# Patient Record
Sex: Male | Born: 1940 | Race: White | Hispanic: No | Marital: Married | Smoking: Former smoker
Health system: Southern US, Community
[De-identification: ages and names within clinical notes are randomized; demographics above are authoritative.]

## PROBLEM LIST (undated history)

## (undated) DIAGNOSIS — I2510 Atherosclerotic heart disease of native coronary artery without angina pectoris: Secondary | ICD-10-CM

## (undated) DIAGNOSIS — E785 Hyperlipidemia, unspecified: Secondary | ICD-10-CM

## (undated) DIAGNOSIS — I10 Essential (primary) hypertension: Secondary | ICD-10-CM

## (undated) HISTORY — PX: OTHER SURGICAL HISTORY: SHX169

## (undated) HISTORY — PX: HAND SURGERY: SHX662

## (undated) HISTORY — DX: Essential (primary) hypertension: I10

## (undated) HISTORY — DX: Hyperlipidemia, unspecified: E78.5

## (undated) HISTORY — PX: TONSILLECTOMY: SUR1361

## (undated) HISTORY — DX: Atherosclerotic heart disease of native coronary artery without angina pectoris: I25.10

---

## 1998-01-05 ENCOUNTER — Encounter: Admission: RE | Admit: 1998-01-05 | Discharge: 1998-01-05 | Payer: Self-pay | Admitting: *Deleted

## 1998-05-13 ENCOUNTER — Ambulatory Visit (HOSPITAL_BASED_OUTPATIENT_CLINIC_OR_DEPARTMENT_OTHER): Admission: RE | Admit: 1998-05-13 | Discharge: 1998-05-13 | Payer: Self-pay | Admitting: Orthopaedic Surgery

## 2001-03-27 HISTORY — PX: CORONARY ARTERY BYPASS GRAFT: SHX141

## 2002-03-11 ENCOUNTER — Inpatient Hospital Stay (HOSPITAL_COMMUNITY): Admission: EM | Admit: 2002-03-11 | Discharge: 2002-03-18 | Payer: Self-pay | Admitting: Critical Care Medicine

## 2002-03-12 ENCOUNTER — Encounter: Payer: Self-pay | Admitting: Thoracic Surgery (Cardiothoracic Vascular Surgery)

## 2002-03-13 ENCOUNTER — Encounter: Payer: Self-pay | Admitting: Thoracic Surgery (Cardiothoracic Vascular Surgery)

## 2002-03-14 ENCOUNTER — Encounter: Payer: Self-pay | Admitting: Thoracic Surgery (Cardiothoracic Vascular Surgery)

## 2002-03-15 ENCOUNTER — Encounter: Payer: Self-pay | Admitting: Thoracic Surgery (Cardiothoracic Vascular Surgery)

## 2003-10-16 ENCOUNTER — Encounter (INDEPENDENT_AMBULATORY_CARE_PROVIDER_SITE_OTHER): Payer: Self-pay | Admitting: *Deleted

## 2003-10-16 ENCOUNTER — Inpatient Hospital Stay (HOSPITAL_COMMUNITY): Admission: RE | Admit: 2003-10-16 | Discharge: 2003-10-18 | Payer: Self-pay | Admitting: Urology

## 2009-07-31 ENCOUNTER — Ambulatory Visit: Payer: Self-pay | Admitting: Vascular Surgery

## 2009-09-01 ENCOUNTER — Ambulatory Visit: Payer: Self-pay | Admitting: Vascular Surgery

## 2009-09-28 ENCOUNTER — Inpatient Hospital Stay (HOSPITAL_COMMUNITY): Admission: RE | Admit: 2009-09-28 | Discharge: 2009-09-29 | Payer: Self-pay | Admitting: Vascular Surgery

## 2009-09-28 HISTORY — PX: ABDOMINAL AORTIC ANEURYSM REPAIR: SHX42

## 2009-10-29 ENCOUNTER — Ambulatory Visit: Payer: Self-pay | Admitting: Vascular Surgery

## 2009-10-29 ENCOUNTER — Encounter: Admission: RE | Admit: 2009-10-29 | Discharge: 2009-10-29 | Payer: Self-pay | Admitting: Vascular Surgery

## 2009-11-16 ENCOUNTER — Ambulatory Visit: Payer: Self-pay | Admitting: Vascular Surgery

## 2009-11-24 ENCOUNTER — Ambulatory Visit: Payer: Self-pay | Admitting: Vascular Surgery

## 2010-04-16 ENCOUNTER — Other Ambulatory Visit: Payer: Self-pay | Admitting: Vascular Surgery

## 2010-04-16 DIAGNOSIS — I714 Abdominal aortic aneurysm, without rupture: Secondary | ICD-10-CM

## 2010-05-02 ENCOUNTER — Other Ambulatory Visit: Payer: Self-pay

## 2010-05-09 ENCOUNTER — Ambulatory Visit
Admission: RE | Admit: 2010-05-09 | Discharge: 2010-05-09 | Disposition: A | Discharge status: Home / Self Care | Payer: Medicare Other | Source: Ambulatory Visit | Attending: Vascular Surgery | Admitting: Vascular Surgery

## 2010-05-09 DIAGNOSIS — I714 Abdominal aortic aneurysm, without rupture: Secondary | ICD-10-CM

## 2010-05-09 MED ORDER — IOHEXOL 350 MG/ML SOLN
100.0000 mL | Freq: Once | INTRAVENOUS | Status: AC | PRN
  Administered 2010-05-09: 100 mL via INTRAVENOUS

## 2010-05-10 ENCOUNTER — Ambulatory Visit (INDEPENDENT_AMBULATORY_CARE_PROVIDER_SITE_OTHER): Payer: Medicare Other | Admitting: Vascular Surgery

## 2010-05-10 ENCOUNTER — Other Ambulatory Visit: Payer: Self-pay

## 2010-05-10 ENCOUNTER — Ambulatory Visit: Payer: Self-pay | Admitting: Vascular Surgery

## 2010-05-10 DIAGNOSIS — I714 Abdominal aortic aneurysm, without rupture, unspecified: Secondary | ICD-10-CM

## 2010-05-11 NOTE — Assessment & Plan Note (Signed)
OFFICE VISIT  ESIAS, MORY DOB:  07/27/1940                                       05/10/2010 EAVWU#:[id number]  Patient presents today for continued followup of his stent graft repair of abdominal aortic aneurysm.  His surgery was in July 2011.  He did have a serous collection in his right groin which was aspirated and had no growth and now is completely resolved.  His health is otherwise quite good.  He has no restrictions.  He has a walking program.  PHYSICAL EXAMINATION:  His blood pressure is 135/82, pulse 72, respirations 20.  In no acute distress.  HEENT is normal.  His abdomen reveals no evidence of pulsatile mass.  He does have normal 2+ femoral pulses bilaterally with well-healed incisions and no evidence of fluid collection.  He underwent a CT scan on 02/13.  I have reviewed his films and discussed this with patient.  This shows excellent positioning of his stent graft with no evidence of endoleak.  His aneurysm size has continued to shrink from 5.0 on his last in August 2011 down to 4.1 mm maximal sac size today.  I am quite pleased with his result.  We will see him again in 1 year with repeat CT scan for continued follow-up.    Larina Earthly, M.D.  TFE/MEDQ  D:  05/10/2010  T:  05/11/2010  Job:  [id number]

## 2010-06-12 LAB — BLOOD GAS, ARTERIAL
Acid-base deficit: 1 mmol/L (ref 0.0–2.0)
Bicarbonate: 23.3 mEq/L (ref 20.0–24.0)
Drawn by: 206361
FIO2: 0.21 %
O2 Saturation: 97.8 %
pO2, Arterial: 96.7 mmHg (ref 80.0–100.0)

## 2010-06-12 LAB — URINALYSIS, ROUTINE W REFLEX MICROSCOPIC
Bilirubin Urine: NEGATIVE
Glucose, UA: NEGATIVE mg/dL
Ketones, ur: NEGATIVE mg/dL
Specific Gravity, Urine: 1.017 (ref 1.005–1.030)
pH: 7.5 (ref 5.0–8.0)

## 2010-06-12 LAB — COMPREHENSIVE METABOLIC PANEL
AST: 17 U/L (ref 0–37)
Albumin: 4.3 g/dL (ref 3.5–5.2)
Alkaline Phosphatase: 51 U/L (ref 39–117)
BUN: 14 mg/dL (ref 6–23)
CO2: 22 mEq/L (ref 19–32)

## 2010-06-12 LAB — BASIC METABOLIC PANEL
CO2: 26 mEq/L (ref 19–32)
Calcium: 8.3 mg/dL — ABNORMAL LOW (ref 8.4–10.5)
Chloride: 107 mEq/L (ref 96–112)
Creatinine, Ser: 0.87 mg/dL (ref 0.4–1.5)
GFR calc non Af Amer: >60 mL/min (ref >60)
Glucose, Bld: 170 mg/dL — ABNORMAL HIGH (ref 70–99)

## 2010-06-12 LAB — PROTIME-INR: Prothrombin Time: 13.3 seconds (ref 11.6–15.2)

## 2010-06-12 LAB — TYPE AND SCREEN: ABO/RH(D): A POS

## 2010-06-12 LAB — CBC
HCT: 39.1 % (ref 39.0–52.0)
Hemoglobin: 11.5 g/dL — ABNORMAL LOW (ref 13.0–17.0)
MCH: 31.2 pg (ref 26.0–34.0)
MCH: 31.3 pg (ref 26.0–34.0)
MCHC: 34 g/dL (ref 30.0–36.0)
MCV: 91.7 fL (ref 78.0–100.0)
MCV: 90.1 fL (ref 78.0–100.0)
Platelets: 150 10*3/uL (ref 150–400)
RBC: 3.68 MIL/uL — ABNORMAL LOW (ref 4.22–5.81)
WBC: 7.2 10*3/uL (ref 4.0–10.5)

## 2010-06-12 LAB — MRSA PCR SCREENING: MRSA by PCR: NEGATIVE

## 2010-08-09 NOTE — Consult Note (Signed)
NEW PATIENT CONSULTATION   [name], [name]  DOB:  1940-11-27                                       09/01/2009  CHART#:[id number]   The patient presents today for discussion of infrarenal abdominal aortic  aneurysm.  This has been known about at least for 3-4 years.  The  patient has a strong family history of aneurysms in his siblings and did  have an ultrasound he feels at least 3-4 years ago showing a maximal  diameter of 3.5 cm.  He has had serial ultrasound followups since that  time and his ultrasound had been suggesting a 4.7 cm aneurysm as  recently as 2-3 months ago.  He recently had an episode while visiting  relatives in [place] where he had frank hematuria.  A CAT scan for further  followup of this showed some left kidney pathology not specifically a  stone and also showed that his aneurysm in 5.3 cm in diameter.  I am  seeing him now for further discussion.  He did not have any evidence of  leak in this and he reports continuing having some left flank pain and  is to undergo urologic evaluation as well.  He does have concerns  regarding the aneurysm and the increase in size.   His past history is significant for coronary artery bypass grafting in  2003.  He does have history of elevated cholesterol.  He does not have  any difficulty with his heart since his bypass.   SOCIAL HISTORY:  He is married with four children.  He is retired.  He  quit smoking 30 years ago and does not drink alcohol.   Family history is significant for premature carotid disease with a heart  attack in his mother, several brothers and open heart surgery in his  sister.  Does have a history of aneurysms in his family as well.   REVIEW OF SYSTEMS:  Is as noted in his chart and is positive for  shortness of breath with exertion.  GI, neurologic, pulmonary and hematologic are negative.  GU:  Positive for lesions on his CT with hematuria.  MUSCULOSKELETAL:  Positive for arthritic  joint pain, muscle pain.  Psychiatric, ENT and skin are all negative.   PHYSICAL EXAMINATION:  General:  A well-developed, well-nourished white  male appearing stated age of 6.  Vital signs:  His blood pressure is  114/75, heart rate 66, respirations 18.  He is in no acute distress.  HEENT:  Normal.  Chest:  Clear bilaterally without wheezes.  Heart:  Regular rate and rhythm without murmurs.  He has 2+ radial, 2+ femoral,  2+ popliteal and 2+ dorsalis pedis pulses bilaterally.  Abdomen:  Soft  and nontender with no masses.  He does have a prominent aortic  pulsation.  Musculoskeletal:  Shows no major deformities or cyanosis.  Neurological:  No focal weakness or paresthesias.  Skin:  Without ulcers  or rashes.   I had his CT scan from [facility] and reviewed this and discussed  it with the patient and his wife.  This does show an infrarenal  abdominal aortic aneurysm extending into his iliac arteries more so on  the left than on the right.  He does have nice long infrarenal neck  below his renal arteries making him a good stent graft candidate.  With  his  increased size to 5.3 cm he does not wish to consider observation  only.  He does have what sounds like relatively rapid growth and I would  agree with treatment.  He will see Dr. Tomie China for preoperative cardiac  clearance and will also see Dr. Lindley Magnus in [facility] for GU evaluation of  his hematuria and renal lesion prior to elective repair of the aneurysm.  I explained both open and stent graft repair and he does have good  anatomy for stent grafting.     Larina Earthly, M.D.  Electronically Signed   TFE/MEDQ  D:  09/01/2009  T:  09/02/2009  Job:  [id number]   cc:   Aundra Dubin. Revankar, M.D.  Dr Charlott Rakes  Wynona Canes, M.D.

## 2010-08-09 NOTE — Assessment & Plan Note (Signed)
OFFICE VISIT   CHIP, CANEPA  DOB:  1940-07-16                                       11/16/2009  OZHYQ#:[id number]   The patient presents today for evaluation of his right groin.  He is  status post stent graft repair of infrarenal abdominal aortic aneurysm  on 09/28/2009.  I had seen him on 10/29/2009 with CT scan.  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  There was no inflammation and no  tenderness and we were going to continue with watchful waiting.  He  called this past weekend on 08/20 speaking with the surgeon on call and  reported a temperature of 101.5, some erythema in the groin area.  He  was placed on Levaquin and given the option of ER versus seeing [place] in  the office.  I am seeing him for further evaluation today.  He has been  taking the Levaquin and has not had any further fever since Saturday.  He does not have any tenderness in his groin and he does continue to  have some diffuse erythema around this region.  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  I have suggested that we aspirate this to rule out any  purulence in the fluid and also to send the fluid for culture.  I  explained that he may indeed require open drainage of this.  I under  sterile conditions with a Betadine prep aspirated 50 mL of clear straw-  colored fluid and this was sent for C and S.  He will continue the  Levaquin and I will see him again in 1 week for continued discussion.     Larina Earthly, M.D.  Electronically Signed   TFE/MEDQ  D:  11/16/2009  T:  11/17/2009  Job:  [id number]

## 2010-08-09 NOTE — Assessment & Plan Note (Signed)
OFFICE VISIT   [name], [name]  DOB:  1941/01/22                                       10/29/2009  CHART#:[id number]   The patient presents today for 1 month followup of Gore stent graft  repair of his infrarenal abdominal aortic aneurysm.  Date of surgery was  09/28/2009.  He did well and was discharged to home without any  difficulty.  He has done quite well and is returning to his baseline.   On physical exam his abdominal exam is benign.  He has no pulsatile  mass.  He does have prominence in his right groin versus his left groin  incision.  He does not have any erythema over this area.  He does have  palpable pedal pulses bilaterally.  He underwent CT scan for stent graft  followup today and I have discussed this with the patient.  This shows  excellent positioning of his stent graft with no evidence of endo leak  and no evidence of increase in aneurysm size.  I am quite pleased with  his result as is the patient and plan to see him again in 6 months with  repeat CT scan.     Larina Earthly, M.D.  Electronically Signed   TFE/MEDQ  D:  10/29/2009  T:  10/29/2009  Job:  [id number]   cc:   Dr Charlott Rakes

## 2010-08-09 NOTE — Assessment & Plan Note (Signed)
OFFICE VISIT   [name], [place]  DOB:  [birth year]                                       11/24/2009  CHART#:[id number]   The patient presents today for a 1 week followup after aspiration of a  seroma in his right groin.  He has had significantly less swelling and  the erythema that was extending around the incision and out to his right  lateral hip is markedly improved as well.  His culture from the  aspiration showed no growth.  There is no discomfort or tenderness.  He  will be seen again in 1 month for followup and then a CT scan in 6  months for routine stent graft followup as well.     Larina Earthly, M.D.  Electronically Signed   TFE/MEDQ  D:  11/24/2009  T:  11/25/2009  Job:  [id number]

## 2010-08-12 NOTE — Consult Note (Signed)
NAME:  [place]                        ACCOUNT NO.:  [id number]   MEDICAL RECORD NO.:  [id number]                   PATIENT TYPE:  INP   LOCATION:  4729                                 FACILITY:  [facility]   PHYSICIAN:  Salvatore Decent. Cornelius Moras, M.D.              DATE OF BIRTH:  [birth year]   DATE OF CONSULTATION:  03/11/2002  DATE OF DISCHARGE:                                   CONSULTATION   PRIMARY CARDIOLOGIST:  Aundra Dubin. Revankar, M.D.   PRIMARY CARE PHYSICIAN:  Landis Gandy, M.D.   REASON FOR CONSULTATION:  Severe three-vessel coronary artery disease with  class III progressive angina.   HISTORY OF PRESENT ILLNESS:  The patient is a 70 year old gentleman from  [place], [place], with no previous cardiac history, but risk factors  including a strong family history of coronary artery disease.  The patient  has a remote history of tobacco use.  Approximately six months ago, he  developed new onset of symptoms of pressure or choking in his upper chest  and neck which were brought on by physical activity and relieved by rest.  He first noticed these while he was mowing his lawn.  The symptoms have  progressed over the last several months and more recently he notes that his  symptoms seem to come on with less physical activity.  They are associated  with shortness of breath and diaphoresis.  He denies any episodes of these  symptoms occurring at rest or with minimal activity.  He denies any episodes  waking him from sleep at night.  He denies any episodes which were prolonged  and not relieved by rest.  He was recently evaluated by his primary care  physician, Landis Gandy, M.D., and he subsequently underwent a stress  Cardiolite exam on March 06, 2002.  This test was abnormal and notable  for findings of a resting ejection fraction of 51% and scintigraphic  evidence suggestive of ischemic in the left anterior descending coronary  artery territory.  He subsequently  underwent elective cardiac  catheterization today by Aundra Dubin. Revankar, M.D.  This demonstrates severe  three-vessel coronary artery disease with normal left ventricular function.  The patient was transferred to [facility] for further  management and cardiac surgical consultation has been requested.   REVIEW OF SYSTEMS:  GENERAL:  The patient reports feeling otherwise well  with the exception of the fact that he has had worsening fatigue and  decreased energy over the last several months.  He reports good appetite  with no recent weight change.  CARDIAC:  Notable for symptoms of exertional  discomfort in his upper chest and neck as previously described.  The patient  denies any problems with resting shortness of breath, PND, orthopnea,  palpitations, or syncope.  He does have exertional shortness of breath which  has gotten worse.  He has had some problems with lower extremity,  particularly in the left leg.  RESPIRATORY:  Notable only for exertional  shortness of breath and shortness of breath which occurs with symptoms of  chest pain as previously outlined.  The patient denies productive cough,  hemoptysis, or wheezing.  GASTROINTESTINAL:  Negative.  The patient reports  good appetite with normal bowel function.  He denies any difficulty  swallowing.  He denies any history of hematochezia, hematemesis, or melena.  NEUROLOGIC:  Negative.  The patient denies any symptoms of transient  monocular blindness or transient numbness or weakness involving either upper  or lower extremities.  He did have problems with double vision in his right  eye, began in early September of this year and lasted approximately a month.  He was seen by an ophthalmologist and apparently had some problem related to  strabismus or other muscular weakness involving the right eye.  ENDOCRINE:  The patient denies symptoms suggestive of diabetes and he denies symptoms  suggestive of hypothyroidism.   INFECTIOUS:  Negative.  The patient denies  recent fevers or chills.  HEMATOLOGICAL:  Negative.  The patient denies  bleeding diathesis, easy bruising, or frequent episodes of epistaxis.  PSYCHIATRIC:  Negative.  HEENT:  Notable for problems with the right eye as  previously mentioned.  The patient is edentulous and has full set of upper  and lower dentures.  PERIPHERAL VASCULAR:  Negative.  The patient denies  symptoms suggestive of claudication.  He has had symptoms problems with  swelling in his left lower leg and mild skin changes suggestive of varicose  veins.  MUSCULOSKELETAL:  Notable for some arthritis involving his right  hand.  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   PAST MEDICAL HISTORY:  Notable for the absence of any previous history of  hypertension, hyperlipidemia, diabetes mellitus, coronary artery disease,  and congestive heart failure.  The patient has been remarkably healthy with  his only primary complaint related to his allergies, particularly afflicting  his hands and mild arthritis in the right hand.   PAST SURGICAL HISTORY:  Notable only for surgery on his right shoulder for  rotator cuff repair in 1999.   FAMILY HISTORY:  Notable in that his mother died of a myocardial infarction  at the age of 58.  He has seven siblings who have had early onset coronary  artery disease.  His brother apparently recently died after open heart  surgery in [place].   SOCIAL HISTORY:  The patient is married and lives [place] his [place] in [place]  He works for the [employer] in [employer].  They have four children and numerous grandchild.  He is currently a  nonsmoker, although he has a remote history of tobacco use.  He quit smoking approximately 16 years ago.  He denies a history of excessive  alcohol  consumption.  He has lived a relatively sedentary lifestyle.   CURRENT MEDICATIONS:  1. Aspirin 325 mg p.o. once daily.  2. Isosorbide 15 mg p.o. once daily.  3. Clarinex one tablet daily.  4. Singulair one tablet daily.  5. Pepcid 20 mg one tablet daily.   ALLERGIES:  The patient denies any known drug allergies or sensitivities.   PHYSICAL EXAMINATION:  GENERAL APPEARANCE:  A well-appearing, white male,  who appears his stated and in no acute distress.  VITAL  SIGNS:  He is currently afebrile and normotensive.  Normal sinus  rhythm on telemetry monitor.  HEENT:  Essentially within normal limits.  The patient wears dentures.  NECK:  Supple.  There is no jugular venous distention.  No carotid bruits  are noted.  LYMPHATICS:  There is no cervical or supraclavicular lymphadenopathy.  CHEST:  Auscultation reveals clear and symmetrical breath sounds  bilaterally.  No wheezes or rhonchi are demonstrated.  CARDIOVASCULAR:  Notable for regular rate and rhythm.  No murmurs, rubs, or  gallops are noted.  ABDOMEN:  Mildly obese, soft, and nontender.  There are no palpable masses.  The liver edge is not enlarged.  Bowel sounds are present.  EXTREMITIES:  Warm and well perfused.  There is trace bilateral lower  extremity edema.  Distal pulses are palpable in both lower legs at the  ankle, although somewhat diminished.  There are mild skin changes of spider  veins in both lower legs suggestive of mild venous insufficiency.  RECTAL:  Deferred.  GENITOURINARY:  Deferred.  NEUROLOGIC:  Grossly nonfocal and symmetrical throughout.  EXTREMITIES:  Examination of the left hand is notable for the presence of  intact palmar arch circulation with normal capillary refill during  reperfusion via the ulnar artery during manual compression of the radial  artery.   The remainder of the physical exam is noncontributory.   DIAGNOSTIC TESTS:  Cardiac catheterization performed by Aundra Dubin. Revankar,   M.D., earlier today has been reviewed.  This demonstrates the presence of  severe three-vessel coronary artery disease with normal left ventricular  function.  Specifically, there is 98-99% proximal stenosis of the left  anterior descending coronary artery with very slow filling of the distal  left anterior descending coronary artery.  There is 70% stenosis of the mid  left circumflex coronary artery just before a large first circumflex  marginal branch.  There is 80-90% proximal stenosis of the right coronary  artery with a long segment 70% stenosis of the mid right coronary artery.  There is right dominant right coronary circulation.  Left ventricular  function appears preserved with the ejection fraction estimated greater than  or equal to 50%.  No other abnormalities are identified.   IMPRESSION:  Severe three-vessel coronary artery disease with normal left ventricular function and class III progressive angina.  I believe that the  patient would benefit from elective coronary artery bypass grafting.   PLAN:  I have outlined at length the indications, risks, and potential  benefits of coronary artery bypass grafting with the patient and his wife.  Alternative treatment strategies have been discussed in detail.  They  understand and accept all associated risks of surgery, including, but not  limited to the risk of death, stroke, myocardial infarction, respiratory  failure, pneumonia, congestive heart failure, bleeding requiring blood  transfusion, arrhythmia, infection, and recurrent coronary artery disease.  They understand the rational for use of bilateral internal mammary arteries  as possible conduit for grafting, as well as the rational and risks  associated with the use of the left radial artery as a conduit for grafting.  They accept all associated risks and desire to proceed with surgery as  described.  All of their questions have been addressed.  Salvatore Decent. Cornelius Moras, M.D.    CHO/MEDQ  D:  03/11/2002  T:  03/12/2002  Job:  [id number]   cc:   Aundra Dubin. Tomie China, M.D.  [address]  [place]  [place] [address]  Fax: [phone]   Learta Codding, M.D. [facility]   Landis Gandy, M.D.

## 2010-08-12 NOTE — Op Note (Signed)
NAME:  Edward Black, Edward Black                        ACCOUNT NO.:  [id number]   MEDICAL RECORD NO.:  [id number]                   PATIENT TYPE:  INP   LOCATION:  2313                                 FACILITY:  [facility]   PHYSICIAN:  Salvatore Decent. Cornelius Moras, M.D.              DATE OF BIRTH:  07-17-1940   DATE OF PROCEDURE:  03/13/2002  DATE OF DISCHARGE:                                 OPERATIVE REPORT   PREOPERATIVE DIAGNOSES:  Severe three-vessel coronary artery disease with  class III progressive angina.   POSTOPERATIVE DIAGNOSES:  Severe three-vessel coronary artery disease with  class III progressive angina.   PROCEDURE:  Median sternotomy for coronary artery bypass grafting x3 (left  internal mammary artery to distal left anterior descending coronary artery,  right internal mammary artery to distal right coronary artery, left radial  artery to first circumflex marginal branch).   SURGEON:  Salvatore Decent. Cornelius Moras, M.D.   ASSISTANT:  Levin Erp. Steward, P.A.   ANESTHESIA:  General.   BRIEF CLINICAL NOTE:  The patient is a 70 year old male followed by Dr.  Charlott Rakes and referred by Dr. Randalyn Rhea and Dr. Lewayne Bunting for  management of coronary artery disease.  The patient presents with a six-  month history of progressive symptoms of exertional angina.  Cardiac  catheterization performed by Dr. Tomie China was notable for the finding of  severe three-vessel coronary artery disease with normal left ventricular  function.  A full consultation note has been dictated previously.   OPERATIVE NOTE IN DETAIL:  The patient was brought into the operating room  on the above-mentioned date and central monitoring was established by the  Anesthesia Service under the care and direction of Dr. Johnathan Hausen.  Specifically, a Swan-Ganz catheter was placed through the right internal  jugular approach.  A right radial arterial line was placed.  Intravenous  antibiotics were administered.  The patient was placed  in the supine  position on the operating table.   The patient's left hand was carefully examined.  A pulse oximetry probe was  placed on the patient's left index finger.  The pulse oximetry wave form was  monitored continuously while the left radial artery was briefly occluded  with digital palpation.  During radial artery occlusion, there remained no  significant change in the pulse oximetry wave form.   General endotracheal anesthesia was induced uneventfully under the care and  direction of Dr. Johnathan Hausen.  A Foley catheter was placed.  The patient's  chest, left upper extremity, abdomen, both groins, and both lower  extremities were prepared and draped in sterile manner.   A median sternotomy incision was performed, and the left internal mammary  artery was dissected from the chest wall and prepared for bypass grafting.  The left internal mammary artery was notably a good-quality conduit.  Simultaneously, the left radial artery was harvested through a longitudinal  incision along the volar aspect  of the left forearm.  During the dissection,  care was taken to avoid manipulation of the superficial thenar branch of the  left radial nerve.  All intervening arterial and venous branches from the  artery and its associated veins were divided between hemoclips.  Electrocautery was avoided during the dissection.  The artery was mobilized  from just above the wrist to the level immediately beyond the takeoff of the  large first interosseous perforating branch below the antecubital fossa.  Prior to transection of the radial artery, the radial artery was briefly  occluded.  One could still appreciate a pulse in the distal portion of the  vessel.  The radial artery was then transected just above the wrist, and the  distal stump was doubly oversewn with silk ligatures.  The proximal portion  of the artery was then flushed with heparinized saline solution and  subsequently inspected for  hemostasis.  The proximal end of the artery was  then transected just beyond the takeoff of the first interosseous  perforating branch.  The arterial graft was placed in a small container with  the patient's heparinized blood containing a small amount of papaverine  solution.  The proximal arterial stump was doubly oversewn with suture  ligatures.  The left forearm incision was irrigated with saline solution and  inspected for hemostasis.  The forearm incision was then closed in multiple  layers in routine fashion.   The right internal mammary artery was now dissected from the chest wall and  prepared for bypass grafting.  The right internal mammary artery was a  notably good quality conduit.  The patient was heparinized systemically.   The pericardium was opened.  The ascending aorta was normal in appearance.  The ascending aorta and the right atrium were cannulated for cardiopulmonary  bypass.  Adequate heparinization was verified.  Cardiopulmonary bypass was  begun, and the surface of the heart was inspected.  Distal sites were  selected for coronary artery bypass grafting.  The left radial artery and  both internal mammary arteries were trimmed to the appropriate length.  A  temperature probe was placed in the left ventricular septum, and a  cardioplegia catheter was placed in the ascending aorta.   The patient was cooled to 32 degrees systemic temperature.  The aortic cross  clamp was applied.  Cardioplegia was delivered in antegrade fashion through  the aortic root.  Iced saline flush was applied for topical hypothermia.  The initial cardioplegic arrest and myocardial cooling were felt to be  satisfactory.  Repeat doses of cardioplegia were administered intermittently  throughout the cross-clamp portion of the operation through the aortic root  to maintain septal temperature below 15 degrees centigrade.   The following distal coronary anastomoses were performed: 1. The first  circumflex marginal branch was grafted with the left radial     artery in an end-to-side fashion.  This coronary measured 1.7 mm in     diameter at the site of distal bypass and was of good quality.  2. The distal right coronary artery was grafted with the right internal     mammary artery in end-to-side fashion.  The right internal mammary artery     was utilized as an in-situ graft.  The distal right coronary artery     measured 1.8 mm in diameter at the site of the distal bypass and was of     good quality.  3. The distal left anterior descending coronary artery was grafted with the  left internal mammary artery in end-to-side fashion.  This coronary     measured 1.5 mm in diameter at the site of distal bypass and was of good     quality.  A single proximal anastomosis of the left radial artery was     performed directly to the ascending aorta prior to removal of the aortic     cross clamp.   The patient was placed in Trendelenburg position.  The septal temperature  was noted to rise rapidly and dramatically upon re-perfusion of the left and  right mammary artery grafts.  All air was evacuated from the aortic root.  The aortic cross clamp was removed after a total cross-clamp time of 60  minutes.   The heart began to beat spontaneously.  All proximal and distal anastomoses  were inspected for hemostasis and appropriate graft orientation.  Epicardial  pacing wires were affixed to the right ventricular out-flow tract and to the  right atrial appendage.  The patient was re-warmed to greater than 37  degrees centigrade temperature.  The patient was weaned from cardiopulmonary  bypass without difficulty.  The patient's rhythm at separation from bypass  was normal sinus rhythm.  No inotropic support was required.  Total  cardiopulmonary bypass time for the operation was 81 minutes.   The venous and arterial cannulae were removed uneventfully.  Protamine was  administered to reverse the  anticoagulation.  The mediastinum and both left  and right pleural spaces were irrigated with saline solution containing  Vancomycin.  Meticulous surgical hemostasis was ascertained.  The  mediastinum and both left and right pleural spaces were drained with four  chest tubes placed through separate stab incisions inferiorly.  The median  sternotomy was closed in routine fashion.  The skin incision was closed with  a subcuticular skin closure.   The patient tolerated the procedure well and was transported to the Surgical  Intensive Care Unit in stable condition.  There were no intraoperative  complications.  All sponge, instrument, and needle counts were verified as  correct at the completion of the operation.  No blood products were  administered.                                               Salvatore Decent. Cornelius Moras, M.D.    CHO/MEDQ  D:  03/13/2002  T:  03/14/2002  Job:  [id number]   cc:   Learta Codding, M.D. [facility] R. Revankar, M.D.  [address] [place]  [place] [address]  Fax: [phone]   Dr. Charlott Rakes

## 2010-08-12 NOTE — Discharge Summary (Signed)
NAME:  Edward Black, Edward Black                        ACCOUNT NO.:  [id number]   MEDICAL RECORD NO.:  [id number]                   PATIENT TYPE:  INP   LOCATION:  2007                                 FACILITY:  [facility]   PHYSICIAN:  Salvatore Decent. Cornelius Moras, M.D.              DATE OF BIRTH:  04-21-1940   DATE OF ADMISSION:  03/11/2002  DATE OF DISCHARGE:  03/17/2002                                 DISCHARGE SUMMARY   ADMISSION DIAGNOSIS:  Coronary artery disease.   SECONDARY DIAGNOSES:  1. Hypercholesterolemia.  2. Postoperative anemia secondary to blood loss.   DISCHARGE DIAGNOSIS:  Coronary artery disease.   HOSPITAL COURSE:  The patient was admitted to [facility]. [facility] on March 11, 2002, after undergoing cardiac catheterization at  [facility].  This revealed that he had significant coronary artery  disease.  Because of this, he was transferred to [facility]. [facility].  Because of his known coronary artery disease, Salvatore Decent. Cornelius Moras,  M.D., was consulted.  On March 13, 2002, Dr. Cornelius Moras performed a coronary  artery bypass graft x 3 with the left internal mammary artery anastomosed to  the left anterior descending artery, the right internal mammary artery  anastomosed to the right coronary artery, and the left radial artery to the  circumflex artery.  No complications were noted during the procedure.  Postoperatively, the patient had mild postoperative anemia secondary to  blood loss.  He did not require transfusion.  His hemoglobin and hematocrit  were stable at 9.8 and 28.  He had intact sensation in motor function in his  left hand after radial artery harvest.  During his postoperative course, he  developed an upper respiratory infection for which he was treated with a  three-course of Zithromax.  The remainder of his postoperative course  remained uneventful and he was subsequently deemed stable for discharge home  on March 17, 2002.   DISCHARGE  MEDICATIONS:  1. Singulair 10 mg one daily.  2. Isosorbide mononitrile 50 mg one daily.  3. Lipitor 10 mg one daily.  4. Aspirin 325 mg one daily.  5. Lopressor 50 mg one-half tablet every 12 hours.  6. Ultram 50 mg one to two tablets every four to six hours as needed for     pain.   ACTIVITY:  The patient was told no driving, strenuous activity, or lifting  heavy objects.  He was told to walk daily and continue breathing exercises.   DISCHARGE DIET:  Low fat, low salt.   WOUND CARE:  The patient was told that he could shower and clean his  incision with soap and water.   DISPOSITION:  Home.    FOLLOW-UP:  The patient was told to call his cardiologist, Aundra Dubin.  Revankar, M.D., for a two-week appointment.  In addition, he was told to see  Salvatore Decent. Cornelius Moras, M.D., at the [facility] office in approximately  three weeks.  The office will call and verify the time and date of this appointment.     Levin Erp. Steward, P.A.                      Salvatore Decent. Cornelius Moras, M.D.    Henrene Hawking  D:  03/16/2002  T:  03/17/2002  Job:  [id number]

## 2010-08-12 NOTE — Op Note (Signed)
NAME:  ARNEY, MAYABB                        ACCOUNT NO.:  [id number]   MEDICAL RECORD NO.:  [id number]                   PATIENT TYPE:  INP   LOCATION:  0002                                 FACILITY:  [facility]   PHYSICIAN:  Maretta Bees. Vonita Moss, M.D.             DATE OF BIRTH:  1941-03-24   DATE OF PROCEDURE:  10/16/2003  DATE OF DISCHARGE:                                 OPERATIVE REPORT   PREOPERATIVE DIAGNOSIS:  Benign prostatic hypertrophy, urinary retention,  and left epididymal orchitis and prostatitis.   POSTOPERATIVE DIAGNOSIS:  Benign prostatic hypertrophy, urinary retention,  left epididymal orchitis and prostatitis.   PROCEDURE:  Transurethral resection of the prostate.   SURGEON:  Maretta Bees. Vonita Moss, M.D.   ANESTHESIA:  General.   This 70 year old white male was admitted for TUR of the prostate because of  urinary retention and failed voiding trials.  He also has some complicating  prostatitis and left epididymal orchitis.  Further details are provided in  the H&P.   PROCEDURE:  Patient is brought to the operating room and placed in a  lithotomy position.  External genitalia were prepped and draped in the usual  fashion.  He was sounded from a 24 to 72 [place] without difficulty.  A 28  French resectoscope sheath was inserted.  The bladder was trabeculated with  no stones, tumors, or inflammatory lesions.  He had a small median lobe but  mainly bilobar hypertrophy.  The bladder neck and small median lobe are  resected first, and then the right and left lobes were resected down to the  capsule.  The prostate was quite vascular due to the size and infection;  however, at the end of the case, there was good hemostasis.  The prostate  was well resected down to the capsule.  Ureteral orifices were intact.  Residual chips removed from the bladder.  As the scope was removed, the  external sphincter was seen to be intact.  A #24 French Foley catheter was  inserted and irrigation  cleared nicely.  The catheter was connected to close  drainage.  Estimated blood loss was less than 200 cc.  She tolerated the  procedure well and was taken to the recovery room in good condition.                                               Maretta Bees. Vonita Moss, M.D.    LJP/MEDQ  D:  10/16/2003  T:  10/16/2003  Job:  [id number]

## 2010-08-12 NOTE — H&P (Signed)
NAME:  Edward Black, Edward Black                        ACCOUNT NO.:  [id number]   MEDICAL RECORD NO.:  [id number]                   PATIENT TYPE:  INP   LOCATION:  0002                                 FACILITY:  [facility]   PHYSICIAN:  Maretta Bees. Vonita Moss, M.D.             DATE OF BIRTH:  1940/08/17   DATE OF ADMISSION:  10/16/2003  DATE OF DISCHARGE:                                HISTORY & PHYSICAL   HISTORY:  This 70 year old white male went into urinary retention after  recent hand surgery.  He was put on Flomax and failed a couple of voiding  trials.  He apparently had to travel out of town, wore a catheter in  [place] for a while.  He has had a previous history of elevated PSAs  with a negative prostate biopsy years ago.  He had developed a prostatitis  and left epididymal orchitis, and he has been on Tequin for the last week.  His workup has included a renal ultrasound that showed a cyst in the lower  pole of the right kidney.  There is no hydronephrosis.  I saw the patient  for a second opinion after his initial therapy with Dr. Brunilda Payor.  I felt that  he needed a TUR of the prostate, and the patient agreed.  He is brought to  the OR today for a TUR of the prostate, knowing there is a risk of  impotence, incontinence, bleeding, retrograde ejaculation, and incontinence.  I told him I though laser microwave would not work nearly as well since he  has been in retention.  His cardiologist recently cleared him for hand  surgery, and he has had no changes to his medical condition since then.   PAST MEDICAL HISTORY:  He has a history of coronary artery disease,  hypertension, and environmental allergies.   PAST SURGICAL HISTORY:  Included rotator cuff surgery, hand surgery,  coronary artery bypass graft x3 by Dr. Cornelius Moras in 2003, and a previous  tonsillectomy as a child.   MEDICATIONS:  1. Lopressor 25 mg b.i.d.  2. Tequin 400 mg daily.  3. Singulair 10 mg at bedtime.  4. Clarinex 5 mg  daily.  5. Lipitor 20 mg at bedtime.  6. Somantadine 20 mg daily.  7. Tricor 48 mg daily.   ALLERGIES:  None.   TOBACCO:  One pack per day.   ALCOHOL:  None.   FAMILY HISTORY:  Noncontributory.   REVIEW OF SYSTEMS:  Noted on the health history form.   PHYSICAL EXAMINATION:  VITAL SIGNS:  Blood pressure 118/78, pulse 72,  temperature 97.  No respiratory distress.  GENERAL:  He is alert and oriented.  SKIN:  Warm and dry.  LUNGS:  Clear.  HEART:  Heart tones normal.  ABDOMEN:  Soft, nontender.  EXTERNAL GENITALIA:  Foley catheter in place with an enlarged left  epididymis and testicle.  Prostate feels enlarged, smooth, and benign.   IMPRESSION:  1. Benign prostatic hypertrophy and urinary retention.  2. Prostatitis and left epididymal orchitis.  3. Hypertension.  4. Coronary artery disease.  5. Environmental allergies.   PLAN:  TUR of the prostate.                                               Maretta Bees. Vonita Moss, M.D.    LJP/MEDQ  D:  10/16/2003  T:  10/16/2003  Job:  [id number]

## 2010-08-12 NOTE — H&P (Signed)
NAME:  Edward Black, FRAZZINI                        ACCOUNT NO.:  [id number]   MEDICAL RECORD NO.:  [id number]                   PATIENT TYPE:  INP   LOCATION:  4729                                 FACILITY:  [facility]   PHYSICIAN:  Learta Codding, M.D. [facility]             DATE OF BIRTH:  [birth year]   DATE OF ADMISSION:  03/11/2002  DATE OF DISCHARGE:                                HISTORY & PHYSICAL   PRIMARY CARE PHYSICIAN:  Francisco M. Yetta Flock, M.D.   HISTORY OF PRESENT ILLNESS:  The patient is a 70 year old male with no  significant medical comorbidities, former smoker, but with a strong family  history of CCAD who has been referred by Dr. Tomie China after cardiac  catheterization revealed three-vessel coronary artery disease. The patient  was referred for coronary artery bypass grafting and evaluation by [facility].   The patient reports a six month history of throat tightness and choking  sensation which occurs on minimal exertion. He has noticed this since this  summer with progressive crescendo symptoms. He denies, however, any  substernal chest pain. He also has noticed increased fatigue and decreased  exercise tolerance. He does complain of pain in the lower extremities, but  this has been rather chronic, and it mainly occurs at night secondary to  venous insufficiency. 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 He denies any substernal chest pain at rest. He also  has no orthopnea or PND. He has no palpitations or syncope. He does complain  of easy fatigability.   ALLERGIES:  No known drug allergies.   MEDICATIONS:  1. Clarinex.  2. Singulair.  3. Imdur 50 mg daily.  4. Aspirin.   PAST MEDICAL HISTORY:  1. Coronary artery disease, status post cardiac catheterization today by Dr.     Tomie China:  LAD 99%, circumflex 67% ostial, 90% proximal, 70% mid RCA with     right to left collaterals.  LV appeared to be preserved. Cardiolite     March 06, 2002,  which lead to the cardiac catheterization, positive     for ischemia in anteroseptal area, EF 51%.  2. History of remote tobacco abuse.  3. History of rotator cuff surgery.  4. History of hyperlipidemia.   SOCIAL HISTORY:  The patient is married and lives in [place]. 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 Quit tobacco 15  years ago. Denies alcohol.   FAMILY HISTORY:  Mother died age 70 from myocardial infarction. Father died  age 59. He also had a brother who died during bypass surgery.   REVIEW OF SYMPTOMS:  Recent complaints of red eyes and rash from allergies  but no chest pain or shortness of breath. Positive for dyspnea on exertion.  No fevers or dysuria. Positive for arthralgias. No nausea or vomiting.   PHYSICAL EXAMINATION:  VITAL SIGNS:  Blood  pressure 135/77, heart rate 72.  GENERAL:  This a 70 year old white male in no acute distress.  HEENT:  Unremarkable. Pupils are isocoric.  NECK:  No bruits. No JVD.  HEART:  Regular, rate, and rhythm. No murmurs, rubs, or gallops.  LUNGS:  Clear.  SKIN:  Warm and dry.  ABDOMEN:  Soft, nontender.  GENITOURINARY/RECTAL:  Deferred.  EXTREMITIES:  Peripheral pulses 2+. No clubbing, cyanosis, or edema.  NEUROLOGICAL:  Appears grossly intact. No focal findings.   LABORATORY DATA:  Chest x-ray has no acute changes on March 10, 2002.   EKG:  Normal sinus rhythm, heart rate 72, no prior infarction patterns.   CBC:  Hemoglobin 14, hematocrit 42, white count 5.6, platelet count 231.  Creatinine 0.8, potassium 4.2. INR 1.0.   IMPRESSION/PLAN:  1. Severe multivessel coronary artery disease. Angiographic images have been     reviewed by Dr. Cornelius Moras. Recommendations have been given by Dr. Tomie China to     proceed with coronary artery bypass surgery, and certainly we agree with     this recommendation. The patient is currently stable and denies     substernal chest pain. He will be placed on aspirin,  Beta-Blocker     therapy, and IV heparin. We anticipate surgery either tomorrow or the     next day.  2. Tobacco abuse, discontinued.  3. Rule out hyperlipidemia. Will need lipid-lowering therapy.  4. Rule out hypertension. Blood pressure stable.   DISPOSITION:  The patient will proceed with coronary bypass grafting as per  recommendation of Dr. Cornelius Moras.                                               Learta Codding, M.D. [facility]    GED/MEDQ  D:  03/11/2002  T:  03/11/2002  Job:  [id number]   cc:   Jolayne Haines  [address]  [place]  [place] [address]  Fax: [phone]   Placido Sou, M.D.

## 2010-10-25 ENCOUNTER — Encounter: Payer: Self-pay | Admitting: Vascular Surgery

## 2010-10-26 ENCOUNTER — Ambulatory Visit (INDEPENDENT_AMBULATORY_CARE_PROVIDER_SITE_OTHER): Payer: Medicare Other | Admitting: Vascular Surgery

## 2010-10-26 ENCOUNTER — Encounter: Payer: Self-pay | Admitting: Vascular Surgery

## 2010-10-26 VITALS — BP 122/68 | HR 65 | Resp 20 | Ht 71.0 in | Wt 205.0 lb

## 2010-10-26 DIAGNOSIS — I83893 Varicose veins of bilateral lower extremities with other complications: Secondary | ICD-10-CM

## 2010-10-26 NOTE — Progress Notes (Signed)
NEW VV PT C/O PAIN,SWELLING OF VEIN IN LEFT CALF

## 2010-10-27 NOTE — Assessment & Plan Note (Signed)
OFFICE VISIT  Edward Black, Edward Black DOB:  May 15, 1940                                       10/26/2010 ZOXWR#:[id number]  Patient presents today for evaluation of left leg superficial thrombophlebitis.  He is well-known to me from a prior stent graft repair of his infrarenal abdominal aortic aneurysm on 09/28/09.  He reports an episode in mid June of this year when he developed sudden soreness, tenderness, and redness in his medial left calf.  This resolved over time.  He currently has no pain and very mild tenderness over this area.  He had no prior history of DVT or superficial thrombophlebitis.  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  PAST MEDICAL HISTORY:  Otherwise unchanged.  PHYSICAL EXAMINATION:  Blood pressure 122/68, pulse 65, respiration 20. HEENT:  Normal.  He does have 2+ posterior tibial pulses bilaterally. He does have marked varicosities in his medial left thigh and medial left calf.  There is a resolving superficial thrombophlebitis in the calf.  He underwent venous SonoSite imaging by myself, and this shows clear reflux in his great saphenous vein with clot in the vein, as noted.  I have discussed this at length with patient.  I do not see any evidence of other more serious difficulty.  He was reassured.  I did explain that recurrent episodes of thrombophlebitis are relative indication for treatment, but he is comfortable with conservative therapy currently. We will see him again in February for repeat CT scan, as scheduled.    Larina Earthly, M.D. Electronically Signed  TFE/MEDQ  D:  10/26/2010  T:  10/27/2010  Job:  [id number]

## 2012-08-30 IMAGING — CT CT CTA ABD/PEL W/CM AND/OR W/O CM
2 of 10 series · 12 of 46 positions shown, 18 images · IV contrast ([ID] [id number])
Comparison: CT angio abdomen pelvis of 10/29/2009

CLINICAL DATA: Post AAA stent placement, follow-up

CT ANGIOGRAPHY ABDOMEN AND PELVIS
TECHNIQUE: Multidetector CT imaging of the abdomen and pelvis was
performed using the standard protocol during bolus administration
of intravenous contrast.  Multiplanar reconstructed images
including MIPs were obtained and reviewed to evaluate the vascular
anatomy.
Contrast:  100 ml [id number]

[Series 6: angio · axial · 0.78mm/px · z∈[-363,+7]mm · 10 of 257 slices shown, 16 images]
[im 24/257  soft-tissue]
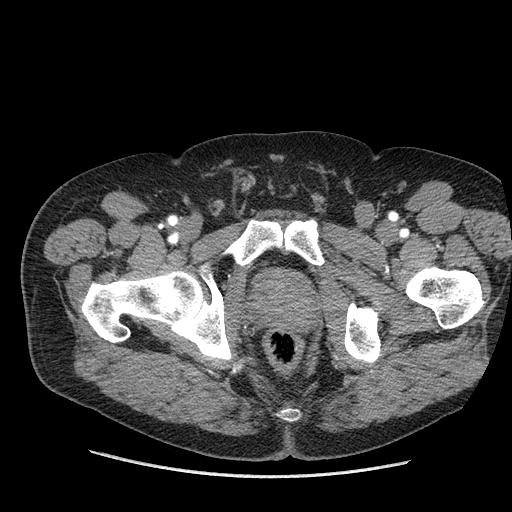
[im 24/257  bone]
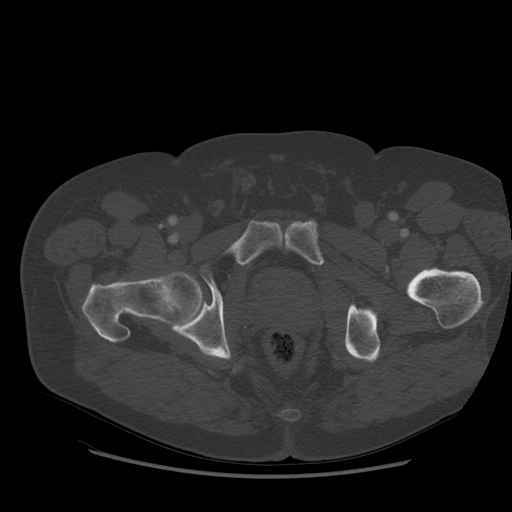
[im 47/257  soft-tissue]
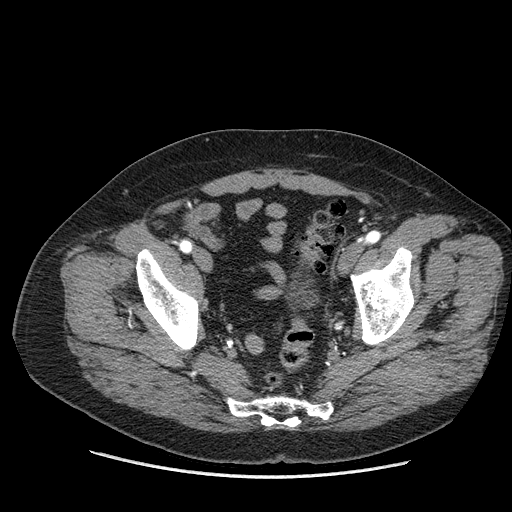
[im 70/257  soft-tissue]
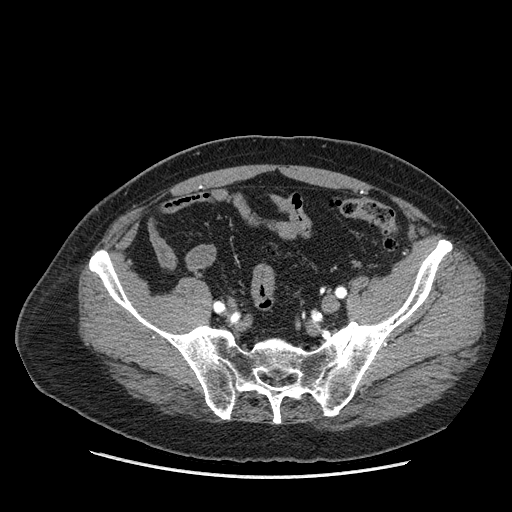
[im 94/257  soft-tissue]
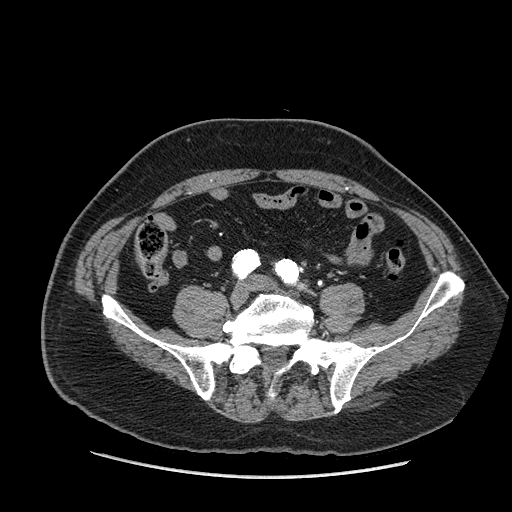
[im 117/257  soft-tissue]
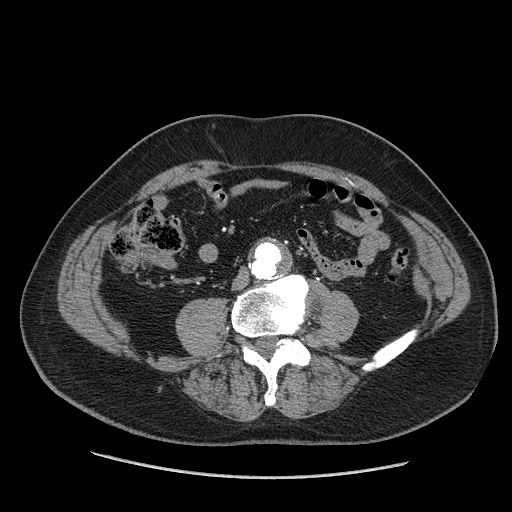
[im 140/257  soft-tissue]
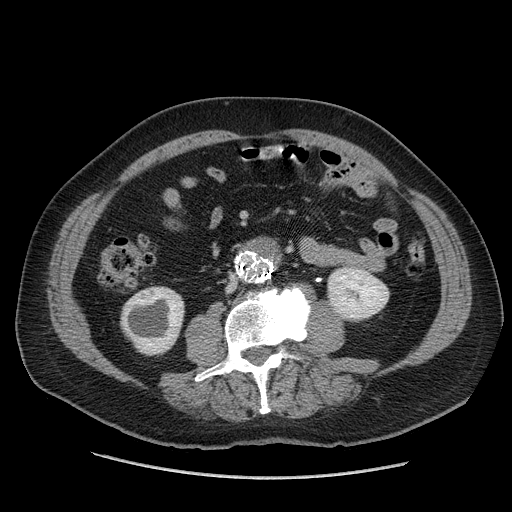
[im 163/257  soft-tissue]
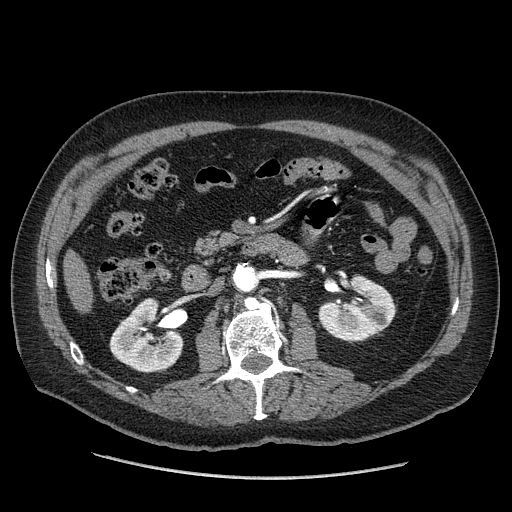
[im 163/257  lung]
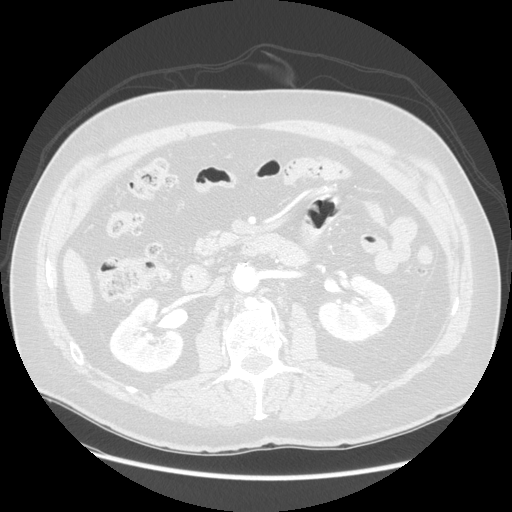
[im 187/257  soft-tissue]
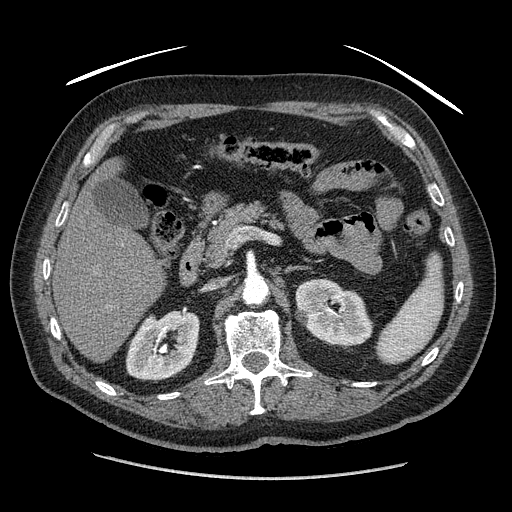
[im 187/257  lung]
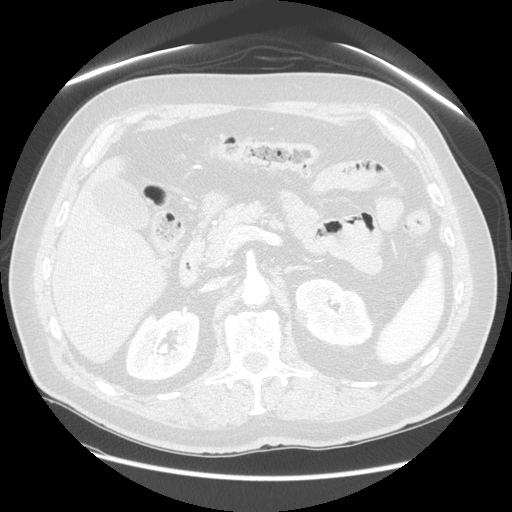
[im 210/257  soft-tissue]
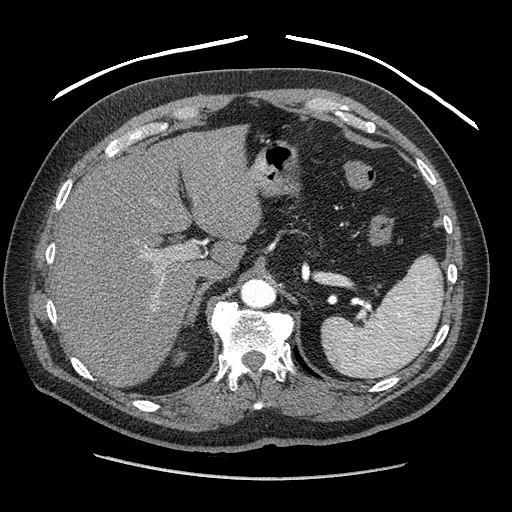
[im 210/257  lung]
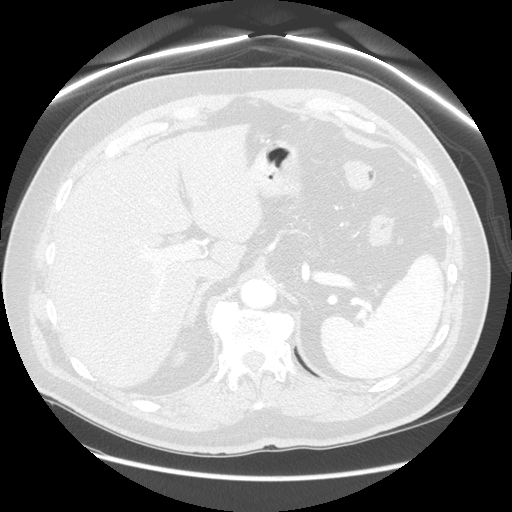
[im 210/257  bone]
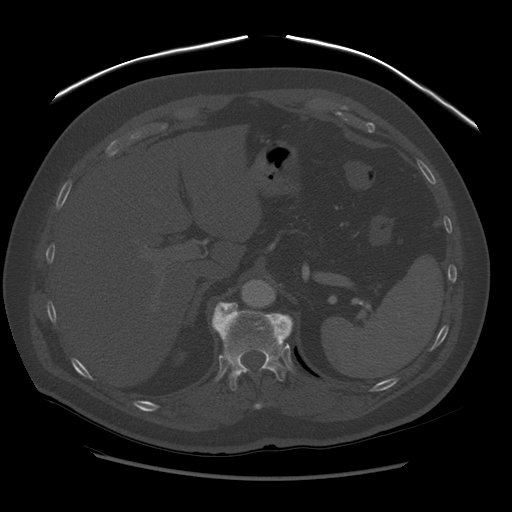
[im 233/257  soft-tissue]
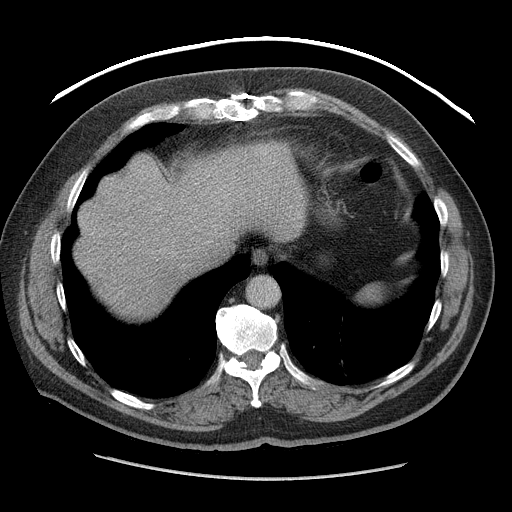
[im 233/257  lung]
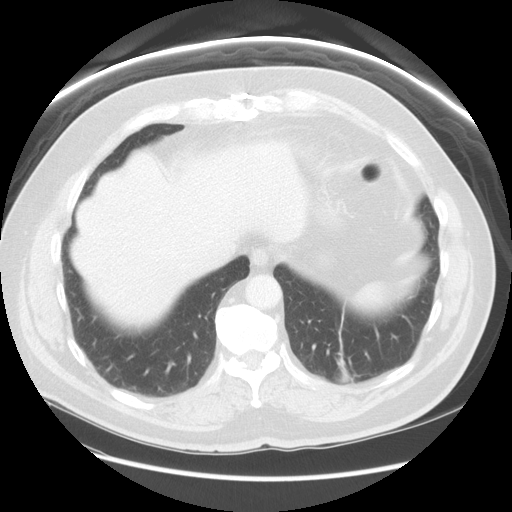

[Series 602: sagittal body · sagittal · 0.97mm/px · 2 of 161 slices shown]
[im 27/161  soft-tissue]
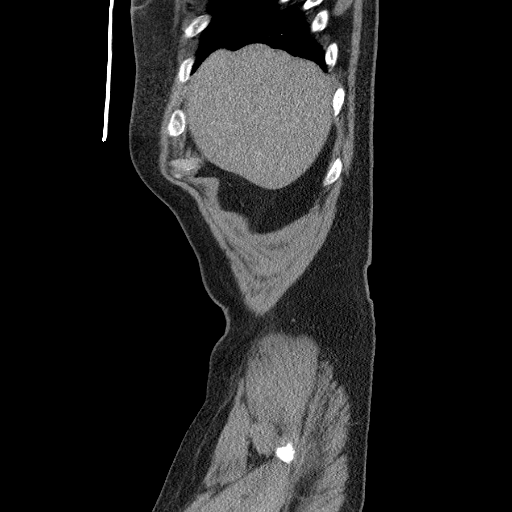
[im 54/161  soft-tissue]
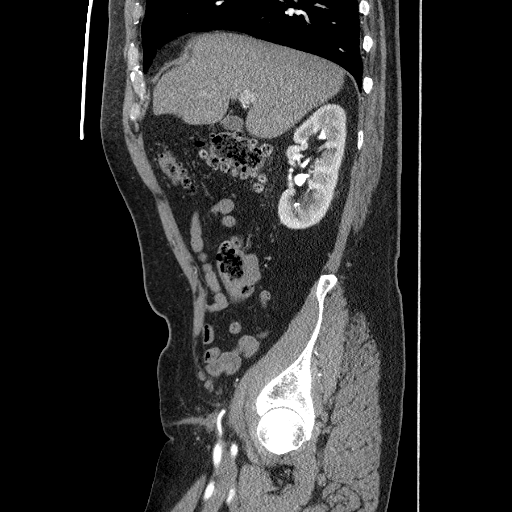

[12 of 46 positions shown; findings below may reference images not displayed]

FINDINGS: Linear scarring or atelectasis remains at the left lower
lobe posteriorly.  The AAA stent remains unchanged in position,
extending to a point proximally just below the origin of the left
renal artery.  The lumen of the stent opacifies well.  On delayed
images there is no evidence of endovascular leak.  The native
aneurysm sac has decreased in size, with maximum diameter currently
of 41 x 36 mm compared to 49 x 50 mm previously.

The liver enhances with no suspicious abnormality.  No calcified
gallstones are seen.  The pancreas is normal in size and the
pancreatic duct is not dilated.  The adrenal glands and spleen are
unremarkable.  The stomach is decompressed.  Renal cysts are
unchanged with the largest in the lower pole of the right kidney
being parapelvic and slightly splaying the lower pole collecting
system on the right.  No adenopathy is seen.

The iliac limbs of the AAA stent opacify well.  Again no
endovascular leak is seen.  There is good opacification of the
internal and external iliac arteries bilaterally.  The previously
noted right groin seroma has resolved.  Multiple rectosigmoid colon
diverticula again are noted.  Diverticula also are scattered
throughout the remainder of the colon.  The terminal ileum and
appendix are unremarkable.  Diffuse degenerative changes noted
throughout the lumbar spine.

 Review of the MIP images confirms the above findings.
IMPRESSION: 1.  Stable AAA stent.  No evidence of endovascular leak.
2.  Decrease in size of native aneurysm sac now measuring 41 x 36
mm compared to 49 x 50 mm previously.
3.  Diffuse colonic diverticula.

## 2013-08-08 ENCOUNTER — Telehealth: Payer: Self-pay | Admitting: Vascular Surgery

## 2013-08-08 NOTE — Telephone Encounter (Signed)
Spoke with pt to schedule appt for 05/19 @ 145pm, dpm

## 2013-08-08 NOTE — Telephone Encounter (Signed)
Message copied by Fredrich BirksMILLIKAN, DANA P on Fri Aug 08, 2013 10:44 AM ------      Message from: Phillips OdorPULLINS, CAROL S      Created: Thu Aug 07, 2013  9:29 AM      Regarding: appt. with TFE      Contact: [phone]       This pt. Is followed for AAA; had EVAR 09/2009.  Recently had [facility]. The pt. dropped off the report and TFE revw'd.  Per recommendation of Dr. Arbie CookeyEarly, pt. Could make appt. To discuss the recent CTA or to f/u in 1 year.  The pt. Is taking a trip 5/22-6/13, and asked if he could see TFE before he left for the trip.  He works on [employer] AM's, so is asking for an afternoon appt.  Is there room to add on to Dr. Bosie HelperEarly's schedule on 5/19 around 3:45?  The pt. Said he understands if we cannot work him in that quick.  I have asked him to obtain a CD ROM of the CTA from the [facility] to bring to the appt. with him.    ------

## 2013-08-11 ENCOUNTER — Encounter: Payer: Self-pay | Admitting: Vascular Surgery

## 2013-08-12 ENCOUNTER — Encounter: Payer: Self-pay | Admitting: Vascular Surgery

## 2013-08-12 ENCOUNTER — Ambulatory Visit (INDEPENDENT_AMBULATORY_CARE_PROVIDER_SITE_OTHER): Payer: Medicare Other | Admitting: Vascular Surgery

## 2013-08-12 VITALS — BP 111/60 | HR 78 | Ht 71.0 in | Wt 211.0 lb

## 2013-08-12 DIAGNOSIS — I714 Abdominal aortic aneurysm, without rupture, unspecified: Secondary | ICD-10-CM

## 2013-08-12 NOTE — Progress Notes (Signed)
Patient name: Edward CanadaFrederick J Cyphers MRN: [id number] DOB: 11/10/1940 Sex: male   [facility]  Reason for referral:  Chief Complaint  Patient presents with  . Re-evaluation    to discuss CTA    HISTORY OF PRESENT ILLNESS: Patient has today for concern regarding recent CT [facility] [place] [facility] [place]. 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 Followup CAT scan was as expected on his initial followup. He was not seen back as scheduled for a subsequent CT scans. He [facility] and was concerned because he was told he had a 3.4 cm aneurysm. He has no symptoms rectal to his aneurysm and has no peripheral vascular occlusive disease.  Past Medical History  Diagnosis Date  . Hyperlipidemia   . Coronary artery disease   . Hypertension     Past Surgical History  Procedure Laterality Date  . Coronary artery bypass graft  2003  . Abdominal aortic aneurysm repair  September 28, 2009  . Tonsillectomy  childhood  . Rotator cuff surgery    . Hand surgery      History   Social History  . Marital Status: Married    Spouse Name: N/A    Number of Children: N/A  . Years of Education: N/A   Occupational History  . Not on file.   Social History Main Topics  . Smoking status: Former Smoker -- 0.50 packs/day    Types: Cigarettes    Quit date: 10/26/1978  . Smokeless tobacco: Never Used  . Alcohol Use: No  . Drug Use: No  . Sexual Activity: Not on file   Other Topics Concern  . Not on file   Social History Narrative  . No narrative on file    Family History  Problem Relation Age of Onset  . Heart disease Mother     before age 74  . Heart disease Brother     before age 74  . Varicose Veins Brother   . Diabetes Sister   . Heart disease Sister     Allergies as of 08/12/2013 - Review Complete 08/12/2013  Allergen Reaction Noted  .  Neosporin [neomycin-polymyxin-gramicidin]  10/26/2010    Current Outpatient Prescriptions on File Prior to Visit  Medication Sig Dispense Refill  . aspirin 81 MG tablet Take 81 mg by mouth daily.        . fenofibrate 160 MG tablet Take 160 mg by mouth daily.        . metoprolol tartrate (LOPRESSOR) 25 MG tablet Take 12.5 mg by mouth 2 (two) times daily.       . OxyCODONE HCl, Abuse Deter, 5 MG TABS Take 5 mg by mouth. 1-2 tablets every 4 hrs. as needed for pain.       . pravastatin (PRAVACHOL) 80 MG tablet Take 80 mg by mouth daily.         No current facility-administered medications on file prior to visit.     REVIEW OF SYSTEMS:  Positives indicated with an "X"  CARDIOVASCULAR:  [ ]  chest pain   [ ]  chest pressure   [ ]  palpitations   [ ]  orthopnea   [ ]  dyspnea on exertion   [ ]  claudication   [ ]  rest pain   [ ]  DVT   [ ]  phlebitis PULMONARY:   [ ]  productive  cough   [ ]  asthma   [ ]  wheezing NEUROLOGIC:   [ ]  weakness  [ ]  paresthesias  [ ]  aphasia  [ ]  amaurosis  [ ]  dizziness HEMATOLOGIC:   [ ]  bleeding problems   [ ]  clotting disorders MUSCULOSKELETAL:  [ ]  joint pain   [ ]  joint swelling GASTROINTESTINAL: [ ]   blood in stool  [ ]   hematemesis GENITOURINARY:  [ ]   dysuria  [ ]   hematuria PSYCHIATRIC:  [ ]  history of major depression INTEGUMENTARY:  [ ]  rashes  [ ]  ulcers CONSTITUTIONAL:  [ ]  fever   [ ]  chills  PHYSICAL EXAMINATION:  General: The patient is a well-nourished male, in no acute distress. Vital signs are BP 111/60  Pulse 78  Ht 5\' 11"  (1.803 m)  Wt 211 lb (95.709 kg)  BMI 29.44 kg/m2  SpO2 96% Pulmonary: There is a good air exchange. Abdomen: Soft and non-tender with no aneurysm palpable Musculoskeletal: There are no major deformities.  There is no significant extremity pain. Neurologic: No focal weakness or paresthesias are detected, Skin: There are no ulcer or rashes noted. Psychiatric: The patient has normal affect. Cardiovascular: 2+ radial 2+  femoral 2+ popliteal and 2+ dorsalis pedis pulses   I have reviewed his CT scan from 07/24/2013. I have reviewed the actual pictures with the patient and his wife as well. This does show excellent positioning of his stent graft for his aneurysm repair. He has had a streaking of size of this aneurysm sac from 5.5 down to 3.4 cm. 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 I explained the patient that his aneurysm his trunk 2 cm and is now down to 3.4 cm. I explained that cannot shrink any further because this is essentially constricted down to the stent graft itself. There are reassured with this and will plan on continued followup. I would recommend CT scan of his aorta in 2 years. He will have this taken care [facility] at his request and will notify us if he is unable to do this.    Kristen Loaderodd F Juniper Snyders [facility] [place] Office: [phone]

## 2014-09-21 ENCOUNTER — Other Ambulatory Visit: Payer: Self-pay

## 2016-12-07 ENCOUNTER — Ambulatory Visit: Payer: Self-pay | Admitting: Allergy and Immunology

## 2016-12-18 ENCOUNTER — Ambulatory Visit: Payer: Self-pay | Admitting: Allergy and Immunology

## 2017-01-04 ENCOUNTER — Encounter: Payer: Self-pay | Admitting: Allergy and Immunology

## 2017-01-04 ENCOUNTER — Ambulatory Visit (INDEPENDENT_AMBULATORY_CARE_PROVIDER_SITE_OTHER): Payer: Medicare Other | Admitting: Allergy and Immunology

## 2017-01-04 VITALS — BP 120/70 | HR 76 | Temp 98.2°F | Resp 16 | Ht 70.0 in | Wt 199.0 lb

## 2017-01-04 DIAGNOSIS — L308 Other specified dermatitis: Secondary | ICD-10-CM | POA: Diagnosis not present

## 2017-01-04 DIAGNOSIS — L989 Disorder of the skin and subcutaneous tissue, unspecified: Secondary | ICD-10-CM

## 2017-01-04 MED ORDER — VALACYCLOVIR HCL 500 MG PO TABS: ORAL_TABLET | ORAL | 5 refills | Status: AC

## 2017-01-04 MED ORDER — PIMECROLIMUS 1 % EX CREA: TOPICAL_CREAM | CUTANEOUS | 5 refills | Status: AC

## 2017-01-04 MED ORDER — HALOBETASOL PROPIONATE 0.05 % EX OINT: TOPICAL_OINTMENT | CUTANEOUS | 5 refills | Status: AC

## 2017-01-04 NOTE — Progress Notes (Signed)
NEW PATIENT NOTE  Referring Provider: No ref. provider found Primary Provider: Charlott Rakes, MD Date of office visit: 01/04/2017    Subjective:   Chief Complaint:  Edward Black (DOB: 10-03-1940) is a 76 y.o. male who presents to the clinic on 01/04/2017 with a chief complaint of Other (dermatitis on face and ankle) .  HPI: Edward Black presents to this clinic in evaluation of dermatosis. I had seen Edward Black back in this clinic almost 10 years ago for very severe hand dermatosis which appeared to be secondary to contact dermatitis.   His hand dermatitis has basically cleared. 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 However, for the past 6 months he has developed several new skin lesions. He has developed a spot on his face that was red and raised and took about one week to resolve with undergoing a "crusting" type of reaction. He has also developed several episodes of red raised itchy lesions on his forearms averaging about 3-5 per episode without any associated systemic or constitutional symptoms. He will treat the skin lesions with Ultravate on his body and alcohol on his lip.  There is no obvious provoking factor giving rise to these issues.  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 He puts Ultravate ointment on this area most days of the week. It has not really changed in appearance in a prolonged period in time.  Past Medical History:  Diagnosis Date  . Coronary artery disease   . Hyperlipidemia   . Hypertension     Past Surgical History:  Procedure Laterality Date  . ABDOMINAL AORTIC ANEURYSM REPAIR  September 28, 2009  . CORONARY ARTERY BYPASS GRAFT  2003  . HAND SURGERY    . rotator cuff surgery    . TONSILLECTOMY  childhood    Allergies as of 01/04/2017      Reactions   Niacin    Oxycodone Other (See Comments)   halucinations     Statins Other (See Comments)   Cant move soreness      Medication List      aspirin 81 MG tablet Take 81 mg by mouth daily.   fenofibrate 160 MG tablet Take 160 mg by mouth daily.   halobetasol 0.05 % ointment Commonly known as:  ULTRAVATE Apply to skin once daily until clear as directed. Apply after Elidel.   metFORMIN 500 MG tablet Commonly known as:  GLUCOPHAGE Take 500 mg by mouth 2 (two) times daily.   metoprolol tartrate 25 MG tablet Commonly known as:  LOPRESSOR Take 12.5 mg by mouth 2 (two) times daily.   montelukast 10 MG tablet Commonly known as:  SINGULAIR Take 10 mg by mouth at bedtime.   nitroGLYCERIN 0.4 MG SL tablet Commonly known as:  NITROSTAT Place 0.4 mg under the tongue.   rosuvastatin 20 MG tablet Commonly known as:  CRESTOR Take 20 mg by mouth daily.       Review of systems negative except as noted in HPI / PMHx or noted below:  Review of Systems  Constitutional: Negative.   HENT: Negative.   Eyes: Negative.   Respiratory: Negative.   Cardiovascular: Negative.   Gastrointestinal: Negative.   Genitourinary: Negative.   Musculoskeletal: Negative.   Skin: Negative.   Neurological: Negative.   Endo/Heme/Allergies: Negative.   Psychiatric/Behavioral: Negative.     Family History  Problem Relation  Age of Onset  . Heart disease Mother        before age 30  . Heart disease Brother        before age 81  . Varicose Veins Brother   . Diabetes Sister   . Heart disease Sister     Social History   Social History  . Marital status: Married    Spouse name: N/A  . Number of children: N/A  . Years of education: N/A   Occupational History  . Not on file.   Social History Main Topics  . Smoking status: Former Smoker    Packs/day: 0.50    Types: Cigarettes    Quit date: 10/26/1978  . Smokeless tobacco: Never Used  . Alcohol use No  . Drug use: No  . Sexual activity: Not on file   Other Topics Concern  . Not on file   Social  History Narrative  . No narrative on file    Environmental and Social history  Lives in a house with a dry environment, no animals located inside the household, plastic on the bed, plastic on the pillow, and no smoking ongoing with inside the household.  Objective:   Vitals:   01/04/17 0902  BP: 120/70  Pulse: 76  Resp: 16  Temp: 98.2 F (36.8 C)   Height:  (177.8 cm) Weight: 199 lb (90.3 kg)  Physical Exam  Constitutional: He is well-developed, well-nourished, and in no distress.  HENT:  Head: Normocephalic.  Right Ear: Tympanic membrane, external ear and ear canal normal.  Left Ear: Tympanic membrane, external ear and ear canal normal.  Nose: Nose normal. No mucosal edema or rhinorrhea.  Mouth/Throat: Uvula is midline, oropharynx is clear and moist and mucous membranes are normal. No oropharyngeal exudate.  Eyes: Conjunctivae are normal.  Neck: Trachea normal. No tracheal tenderness present. No tracheal deviation present. No thyromegaly present.  Cardiovascular: Normal rate, regular rhythm, S1 normal, S2 normal and normal heart sounds.   No murmur heard. Pulmonary/Chest: Breath sounds normal. No stridor. No respiratory distress. He has no wheezes. He has no rales.  Musculoskeletal: He exhibits no edema.  Lymphadenopathy:       Head (right side): No tonsillar adenopathy present.       Head (left side): No tonsillar adenopathy present.    He has no cervical adenopathy.  Neurological: He is alert. Gait normal.  Skin: Rash (5 cm diameter hyperpigmented lichenified scaly lesion affecting lateral aspect of left ankle without significant erythema) noted. He is not diaphoretic. No erythema. Nails show no clubbing.  Psychiatric: Mood and affect normal.    Diagnostics: Allergy skin tests were not performed.   Assessment and Plan:    1. Inflammatory dermatosis     1. For left ankle dermatitis utilize the following:   A. Elidel followed by Ultravate ointment once a  day until clear  2. During facial "flareup" utilize the following:   A. Valtrex 500 mg twice a day for 7 days  B. OTC 1% hydrocortisone ointment twice a day  3. During body "flareup" utilize the following:   A. Elidel followed by Ultravate ointment once a day until clear  4. Discontinue Singulair  5. Can use OTC cetirizine 10 mg one-2 times per day for itchiness  6. Further evaluation? Yes, if plan ineffective  7. Return to clinic in 6 months or earlier if problem  Edward Black appears to be doing much better regarding his long-standing hand dermatitis but he appears to have a  persistent area affecting his left ankle and I have given him a plan to address this issue and he will contact me noting his response over the course of the next month or so. It sounds as though he has developed a fever blister sometime in the past few months and I have given him a plan to address that issue if it is recurrent. As well, he does appear to develop some intermittent inflammatory dermatosis episodes involving his extremities for which I have given him a plan as well. He will follow-up in this clinic in 6 months or earlier if there is a problem. I don't see any need for him to continue on Singulair as this is probably not atopic in nature.  Laurette Schimke, MD Allergy / Immunology [facility]

## 2017-01-04 NOTE — Patient Instructions (Addendum)
  1. For left ankle dermatitis utilize the following:   A. Elidel followed by Ultravate ointment once a day until clear  2. During facial "flareup" utilize the following:   A. Valtrex 500 mg twice a day for 7 days  B. OTC 1% hydrocortisone ointment twice a day  3. During body "flareup" utilize the following:   A. Elidel followed by Ultravate ointment once a day until clear  4. Discontinue Singulair  5. Can use OTC cetirizine 10 mg one-2 times per day for itchiness  6. Further evaluation? Yes, if plan ineffective  7. Return to clinic in 6 months or earlier if problem

## 2017-07-05 ENCOUNTER — Ambulatory Visit: Payer: Medicare Other | Admitting: Allergy and Immunology

## 2017-07-05 ENCOUNTER — Encounter: Payer: Self-pay | Admitting: Allergy and Immunology

## 2017-07-05 VITALS — BP 160/80 | HR 60 | Resp 22

## 2017-07-05 DIAGNOSIS — L308 Other specified dermatitis: Secondary | ICD-10-CM

## 2017-07-05 DIAGNOSIS — L989 Disorder of the skin and subcutaneous tissue, unspecified: Secondary | ICD-10-CM

## 2017-07-05 MED ORDER — HALOBETASOL PROPIONATE 0.05 % EX CREA: 1.0000 "application " | TOPICAL_CREAM | Freq: Two times a day (BID) | CUTANEOUS | 5 refills | Status: DC

## 2017-07-05 NOTE — Progress Notes (Signed)
Follow-up Note  Referring Provider: Charlott Rakes, MD Primary Provider: Charlott Rakes, MD Date of Office Visit: 07/05/2017  Subjective:   Edward Black (DOB: May 25, 1940) is a 77 y.o. male who [facility] in re-evaluation of the following:  HPI: Edward Black returns to this clinic in reevaluation of his inflammatory dermatosis.  I have not seen him in this clinic since 04 January 2017.  At that point in time he appeared to have intermittent issues with herpes labialis and continued to have intermittent inflammatory lesions of his extremities.  Valtrex did help the issue with his fever blister.  The use of Elidel followed by Ultravate has helped with the patches that come up on his body.  Usually he needs to apply this treatment for 3 days and everything completely resolves.  He has these lesions develop less than every month.  Usually they are solitary and usually involve his lower extremities.  Allergies as of 07/05/2017      Reactions   Niacin    Oxycodone Other (See Comments)   halucinations   Statins Other (See Comments)   Cant move soreness      Medication List      aspirin 81 MG tablet Take 81 mg by mouth daily.   fenofibrate 160 MG tablet Take 160 mg by mouth daily.   halobetasol 0.05 % ointment Commonly known as:  ULTRAVATE Apply to skin once daily until clear as directed. Apply after Elidel.   halobetasol 0.05 % cream Commonly known as:  ULTRAVATE Apply 1 application topically 2 (two) times daily.   metFORMIN 500 MG tablet Commonly known as:  GLUCOPHAGE Take 500 mg by mouth 2 (two) times daily.   metoprolol tartrate 25 MG tablet Commonly known as:  LOPRESSOR Take 12.5 mg by mouth 2 (two) times daily.   montelukast 10 MG tablet Commonly known as:  SINGULAIR Take 10 mg by mouth at bedtime.   nitroGLYCERIN 0.4 MG SL tablet Commonly known as:  NITROSTAT Place 0.4 mg under the tongue.   pimecrolimus 1  % cream Commonly known as:  ELIDEL Apply to skin once daily until clear as directed.   rosuvastatin 20 MG tablet Commonly known as:  CRESTOR Take 20 mg by mouth daily.   temazepam 15 MG capsule Commonly known as:  RESTORIL Take 1 capsule by mouth daily as needed.   valACYclovir 500 MG tablet Commonly known as:  VALTREX Take one tablet by mouth twice daily for seven days as directed.       Past Medical History:  Diagnosis Date  . Coronary artery disease   . Hyperlipidemia   . Hypertension     Past Surgical History:  Procedure Laterality Date  . ABDOMINAL AORTIC ANEURYSM REPAIR  September 28, 2009  . CORONARY ARTERY BYPASS GRAFT  2003  . HAND SURGERY    . rotator cuff surgery    . TONSILLECTOMY  childhood    Review of systems negative except as noted in HPI / PMHx or noted below:  Review of Systems  Constitutional: Negative.   HENT: Negative.   Eyes: Negative.   Respiratory: Negative.   Cardiovascular: Negative.   Gastrointestinal: Negative.   Genitourinary: Negative.   Musculoskeletal: Negative.   Skin: Negative.   Neurological: Negative.   Endo/Heme/Allergies: Negative.   Psychiatric/Behavioral: Negative.      Objective:   Vitals:   07/05/17 0830  BP: (!) 160/80  Pulse: 60  Resp: (!) 22  Physical Exam  HENT:  Head: Normocephalic.  Right Ear: Tympanic membrane, external ear and ear canal normal.  Left Ear: Tympanic membrane, external ear and ear canal normal.  Nose: Nose normal. No mucosal edema or rhinorrhea.  Mouth/Throat: Uvula is midline, oropharynx is clear and moist and mucous membranes are normal. No oropharyngeal exudate.  Eyes: Conjunctivae are normal.  Neck: Trachea normal. No tracheal tenderness present. No tracheal deviation present. No thyromegaly present.  Cardiovascular: Normal rate, regular rhythm, S1 normal, S2 normal and normal heart sounds.  No murmur heard. Pulmonary/Chest: Breath sounds normal. No stridor. No respiratory  distress. He has no wheezes. He has no rales.  Musculoskeletal: He exhibits no edema.  Lymphadenopathy:       Head (right side): No tonsillar adenopathy present.       Head (left side): No tonsillar adenopathy present.    He has no cervical adenopathy.  Neurological: He is alert.  Skin: No rash noted. He is not diaphoretic. No erythema. Nails show no clubbing.    Diagnostics: none   Assessment and Plan:   1. Inflammatory dermatosis     1. During body "flareup" utilize the following:   A. Elidel followed by Ultravate ointment once a day until clear  2. Can use OTC cetirizine 10 mg one-2 times per day for itchiness  3. Return to clinic in one year or earlier if problem  Edward AlbertFred has really done well on his current plan for intermittent episodes of cutaneous inflammation and we will now see him back in this clinic in 1 year or earlier if there is a problem.  Edward SchimkeEric Lowanda Cashaw, MD Allergy / Immunology [facility]

## 2017-07-05 NOTE — Patient Instructions (Addendum)
  1. During body "flareup" utilize the following:   A. Elidel followed by Ultravate ointment once a day until clear  2. Can use OTC cetirizine 10 mg one-2 times per day for itchiness  3. Return to clinic in one year or earlier if problem

## 2017-07-09 ENCOUNTER — Encounter: Payer: Self-pay | Admitting: Allergy and Immunology

## 2018-07-01 ENCOUNTER — Ambulatory Visit: Payer: Medicare Other | Admitting: Allergy and Immunology

## 2018-08-21 ENCOUNTER — Other Ambulatory Visit: Payer: Self-pay | Admitting: Allergy and Immunology

## 2018-08-21 NOTE — Telephone Encounter (Signed)
Courtesy refill  

## 2023-03-28 DIAGNOSIS — E785 Hyperlipidemia, unspecified: Secondary | ICD-10-CM | POA: Diagnosis not present

## 2023-03-28 DIAGNOSIS — E1129 Type 2 diabetes mellitus with other diabetic kidney complication: Secondary | ICD-10-CM | POA: Diagnosis not present

## 2023-03-29 DIAGNOSIS — E1129 Type 2 diabetes mellitus with other diabetic kidney complication: Secondary | ICD-10-CM | POA: Diagnosis not present

## 2023-04-03 DIAGNOSIS — I700 Atherosclerosis of aorta: Secondary | ICD-10-CM | POA: Diagnosis not present

## 2023-04-03 DIAGNOSIS — N400 Enlarged prostate without lower urinary tract symptoms: Secondary | ICD-10-CM | POA: Diagnosis not present

## 2023-04-03 DIAGNOSIS — N3289 Other specified disorders of bladder: Secondary | ICD-10-CM | POA: Diagnosis not present

## 2023-04-03 DIAGNOSIS — N281 Cyst of kidney, acquired: Secondary | ICD-10-CM | POA: Diagnosis not present

## 2023-04-03 DIAGNOSIS — R310 Gross hematuria: Secondary | ICD-10-CM | POA: Diagnosis not present

## 2023-04-03 DIAGNOSIS — K5730 Diverticulosis of large intestine without perforation or abscess without bleeding: Secondary | ICD-10-CM | POA: Diagnosis not present

## 2023-04-04 DIAGNOSIS — E785 Hyperlipidemia, unspecified: Secondary | ICD-10-CM | POA: Diagnosis not present

## 2023-04-04 DIAGNOSIS — E1129 Type 2 diabetes mellitus with other diabetic kidney complication: Secondary | ICD-10-CM | POA: Diagnosis not present

## 2023-04-04 DIAGNOSIS — I2510 Atherosclerotic heart disease of native coronary artery without angina pectoris: Secondary | ICD-10-CM | POA: Diagnosis not present

## 2023-04-05 DIAGNOSIS — R310 Gross hematuria: Secondary | ICD-10-CM | POA: Diagnosis not present

## 2023-04-20 DIAGNOSIS — Z7982 Long term (current) use of aspirin: Secondary | ICD-10-CM | POA: Diagnosis not present

## 2023-04-20 DIAGNOSIS — Z7902 Long term (current) use of antithrombotics/antiplatelets: Secondary | ICD-10-CM | POA: Diagnosis not present

## 2023-04-20 DIAGNOSIS — E1165 Type 2 diabetes mellitus with hyperglycemia: Secondary | ICD-10-CM | POA: Diagnosis not present

## 2023-04-20 DIAGNOSIS — D62 Acute posthemorrhagic anemia: Secondary | ICD-10-CM | POA: Diagnosis not present

## 2023-04-20 DIAGNOSIS — I739 Peripheral vascular disease, unspecified: Secondary | ICD-10-CM | POA: Diagnosis not present

## 2023-04-20 DIAGNOSIS — Z95820 Peripheral vascular angioplasty status with implants and grafts: Secondary | ICD-10-CM | POA: Diagnosis not present

## 2023-04-20 DIAGNOSIS — N1339 Other hydronephrosis: Secondary | ICD-10-CM | POA: Diagnosis not present

## 2023-04-20 DIAGNOSIS — Z794 Long term (current) use of insulin: Secondary | ICD-10-CM | POA: Diagnosis not present

## 2023-04-20 DIAGNOSIS — I350 Nonrheumatic aortic (valve) stenosis: Secondary | ICD-10-CM | POA: Diagnosis not present

## 2023-04-20 DIAGNOSIS — Z95828 Presence of other vascular implants and grafts: Secondary | ICD-10-CM | POA: Diagnosis not present

## 2023-04-20 DIAGNOSIS — K5730 Diverticulosis of large intestine without perforation or abscess without bleeding: Secondary | ICD-10-CM | POA: Diagnosis not present

## 2023-04-20 DIAGNOSIS — I10 Essential (primary) hypertension: Secondary | ICD-10-CM | POA: Diagnosis not present

## 2023-04-20 DIAGNOSIS — N401 Enlarged prostate with lower urinary tract symptoms: Secondary | ICD-10-CM | POA: Diagnosis not present

## 2023-04-20 DIAGNOSIS — R310 Gross hematuria: Secondary | ICD-10-CM | POA: Diagnosis not present

## 2023-04-20 DIAGNOSIS — N1330 Unspecified hydronephrosis: Secondary | ICD-10-CM | POA: Diagnosis not present

## 2023-04-20 DIAGNOSIS — N179 Acute kidney failure, unspecified: Secondary | ICD-10-CM | POA: Diagnosis not present

## 2023-04-20 DIAGNOSIS — Z951 Presence of aortocoronary bypass graft: Secondary | ICD-10-CM | POA: Diagnosis not present

## 2023-04-20 DIAGNOSIS — Z5321 Procedure and treatment not carried out due to patient leaving prior to being seen by health care provider: Secondary | ICD-10-CM | POA: Diagnosis not present

## 2023-04-20 DIAGNOSIS — N400 Enlarged prostate without lower urinary tract symptoms: Secondary | ICD-10-CM | POA: Diagnosis not present

## 2023-04-20 DIAGNOSIS — I2510 Atherosclerotic heart disease of native coronary artery without angina pectoris: Secondary | ICD-10-CM | POA: Diagnosis not present

## 2023-04-20 DIAGNOSIS — E785 Hyperlipidemia, unspecified: Secondary | ICD-10-CM | POA: Diagnosis not present

## 2023-04-20 DIAGNOSIS — N134 Hydroureter: Secondary | ICD-10-CM | POA: Diagnosis not present

## 2023-04-20 DIAGNOSIS — N138 Other obstructive and reflux uropathy: Secondary | ICD-10-CM | POA: Diagnosis not present

## 2023-04-20 DIAGNOSIS — R338 Other retention of urine: Secondary | ICD-10-CM | POA: Diagnosis not present

## 2023-04-20 DIAGNOSIS — Z952 Presence of prosthetic heart valve: Secondary | ICD-10-CM | POA: Diagnosis not present

## 2023-04-20 DIAGNOSIS — N289 Disorder of kidney and ureter, unspecified: Secondary | ICD-10-CM | POA: Diagnosis not present

## 2023-04-20 DIAGNOSIS — Q619 Cystic kidney disease, unspecified: Secondary | ICD-10-CM | POA: Diagnosis not present

## 2023-04-20 DIAGNOSIS — Z8679 Personal history of other diseases of the circulatory system: Secondary | ICD-10-CM | POA: Diagnosis not present

## 2023-04-20 DIAGNOSIS — Z7984 Long term (current) use of oral hypoglycemic drugs: Secondary | ICD-10-CM | POA: Diagnosis not present

## 2023-04-20 DIAGNOSIS — R319 Hematuria, unspecified: Secondary | ICD-10-CM | POA: Diagnosis not present

## 2023-04-20 DIAGNOSIS — Z9079 Acquired absence of other genital organ(s): Secondary | ICD-10-CM | POA: Diagnosis not present

## 2023-04-20 DIAGNOSIS — Z87891 Personal history of nicotine dependence: Secondary | ICD-10-CM | POA: Diagnosis not present

## 2023-04-20 DIAGNOSIS — R339 Retention of urine, unspecified: Secondary | ICD-10-CM | POA: Diagnosis not present

## 2023-04-20 DIAGNOSIS — G4700 Insomnia, unspecified: Secondary | ICD-10-CM | POA: Diagnosis not present

## 2023-04-20 DIAGNOSIS — R1030 Lower abdominal pain, unspecified: Secondary | ICD-10-CM | POA: Diagnosis not present

## 2023-04-20 DIAGNOSIS — E1151 Type 2 diabetes mellitus with diabetic peripheral angiopathy without gangrene: Secondary | ICD-10-CM | POA: Diagnosis not present

## 2023-04-21 DIAGNOSIS — R310 Gross hematuria: Secondary | ICD-10-CM | POA: Diagnosis not present

## 2023-04-21 DIAGNOSIS — R339 Retention of urine, unspecified: Secondary | ICD-10-CM | POA: Diagnosis not present

## 2023-04-21 DIAGNOSIS — N1330 Unspecified hydronephrosis: Secondary | ICD-10-CM | POA: Diagnosis not present

## 2023-04-27 DIAGNOSIS — Z7689 Persons encountering health services in other specified circumstances: Secondary | ICD-10-CM | POA: Diagnosis not present

## 2023-04-27 DIAGNOSIS — Z6828 Body mass index (BMI) 28.0-28.9, adult: Secondary | ICD-10-CM | POA: Diagnosis not present

## 2023-04-27 DIAGNOSIS — I2510 Atherosclerotic heart disease of native coronary artery without angina pectoris: Secondary | ICD-10-CM | POA: Diagnosis not present

## 2023-04-27 DIAGNOSIS — R319 Hematuria, unspecified: Secondary | ICD-10-CM | POA: Diagnosis not present

## 2023-05-04 DIAGNOSIS — R8290 Unspecified abnormal findings in urine: Secondary | ICD-10-CM | POA: Diagnosis not present

## 2023-05-04 DIAGNOSIS — N138 Other obstructive and reflux uropathy: Secondary | ICD-10-CM | POA: Diagnosis not present

## 2023-05-04 DIAGNOSIS — N401 Enlarged prostate with lower urinary tract symptoms: Secondary | ICD-10-CM | POA: Diagnosis not present

## 2023-05-08 DIAGNOSIS — Z7982 Long term (current) use of aspirin: Secondary | ICD-10-CM | POA: Diagnosis not present

## 2023-05-08 DIAGNOSIS — I071 Rheumatic tricuspid insufficiency: Secondary | ICD-10-CM | POA: Diagnosis not present

## 2023-05-08 DIAGNOSIS — I272 Pulmonary hypertension, unspecified: Secondary | ICD-10-CM | POA: Diagnosis not present

## 2023-05-08 DIAGNOSIS — Z953 Presence of xenogenic heart valve: Secondary | ICD-10-CM | POA: Diagnosis not present

## 2023-05-08 DIAGNOSIS — I38 Endocarditis, valve unspecified: Secondary | ICD-10-CM | POA: Diagnosis not present

## 2023-05-08 DIAGNOSIS — I350 Nonrheumatic aortic (valve) stenosis: Secondary | ICD-10-CM | POA: Diagnosis not present

## 2023-05-11 DIAGNOSIS — I38 Endocarditis, valve unspecified: Secondary | ICD-10-CM | POA: Diagnosis not present

## 2023-05-26 DIAGNOSIS — E1129 Type 2 diabetes mellitus with other diabetic kidney complication: Secondary | ICD-10-CM | POA: Diagnosis not present

## 2023-05-26 DIAGNOSIS — E785 Hyperlipidemia, unspecified: Secondary | ICD-10-CM | POA: Diagnosis not present

## 2023-06-26 DIAGNOSIS — I2510 Atherosclerotic heart disease of native coronary artery without angina pectoris: Secondary | ICD-10-CM | POA: Diagnosis not present

## 2023-06-26 DIAGNOSIS — E1129 Type 2 diabetes mellitus with other diabetic kidney complication: Secondary | ICD-10-CM | POA: Diagnosis not present

## 2023-07-05 DIAGNOSIS — Z48816 Encounter for surgical aftercare following surgery on the genitourinary system: Secondary | ICD-10-CM | POA: Diagnosis not present

## 2023-07-05 DIAGNOSIS — N138 Other obstructive and reflux uropathy: Secondary | ICD-10-CM | POA: Diagnosis not present

## 2023-07-05 DIAGNOSIS — N401 Enlarged prostate with lower urinary tract symptoms: Secondary | ICD-10-CM | POA: Diagnosis not present

## 2023-07-31 DIAGNOSIS — E1129 Type 2 diabetes mellitus with other diabetic kidney complication: Secondary | ICD-10-CM | POA: Diagnosis not present

## 2023-08-06 DIAGNOSIS — I2510 Atherosclerotic heart disease of native coronary artery without angina pectoris: Secondary | ICD-10-CM | POA: Diagnosis not present

## 2023-08-06 DIAGNOSIS — E1129 Type 2 diabetes mellitus with other diabetic kidney complication: Secondary | ICD-10-CM | POA: Diagnosis not present

## 2023-08-06 DIAGNOSIS — Z6829 Body mass index (BMI) 29.0-29.9, adult: Secondary | ICD-10-CM | POA: Diagnosis not present

## 2023-08-06 DIAGNOSIS — E785 Hyperlipidemia, unspecified: Secondary | ICD-10-CM | POA: Diagnosis not present

## 2023-08-13 DIAGNOSIS — I2510 Atherosclerotic heart disease of native coronary artery without angina pectoris: Secondary | ICD-10-CM | POA: Diagnosis not present

## 2023-08-13 DIAGNOSIS — Z8679 Personal history of other diseases of the circulatory system: Secondary | ICD-10-CM | POA: Diagnosis not present

## 2023-08-13 DIAGNOSIS — E782 Mixed hyperlipidemia: Secondary | ICD-10-CM | POA: Diagnosis not present

## 2023-08-13 DIAGNOSIS — Z952 Presence of prosthetic heart valve: Secondary | ICD-10-CM | POA: Diagnosis not present

## 2023-08-13 DIAGNOSIS — I10 Essential (primary) hypertension: Secondary | ICD-10-CM | POA: Diagnosis not present

## 2023-08-13 DIAGNOSIS — Z951 Presence of aortocoronary bypass graft: Secondary | ICD-10-CM | POA: Diagnosis not present

## 2023-09-21 DIAGNOSIS — C44629 Squamous cell carcinoma of skin of left upper limb, including shoulder: Secondary | ICD-10-CM | POA: Diagnosis not present

## 2023-09-21 DIAGNOSIS — L821 Other seborrheic keratosis: Secondary | ICD-10-CM | POA: Diagnosis not present

## 2023-10-26 DIAGNOSIS — E1129 Type 2 diabetes mellitus with other diabetic kidney complication: Secondary | ICD-10-CM | POA: Diagnosis not present

## 2023-10-26 DIAGNOSIS — I2510 Atherosclerotic heart disease of native coronary artery without angina pectoris: Secondary | ICD-10-CM | POA: Diagnosis not present

## 2023-11-07 DIAGNOSIS — Z6829 Body mass index (BMI) 29.0-29.9, adult: Secondary | ICD-10-CM | POA: Diagnosis not present

## 2023-11-07 DIAGNOSIS — I872 Venous insufficiency (chronic) (peripheral): Secondary | ICD-10-CM | POA: Diagnosis not present

## 2023-11-08 DIAGNOSIS — N401 Enlarged prostate with lower urinary tract symptoms: Secondary | ICD-10-CM | POA: Diagnosis not present

## 2023-11-08 DIAGNOSIS — N138 Other obstructive and reflux uropathy: Secondary | ICD-10-CM | POA: Diagnosis not present

## 2023-11-26 DIAGNOSIS — I2510 Atherosclerotic heart disease of native coronary artery without angina pectoris: Secondary | ICD-10-CM | POA: Diagnosis not present

## 2023-11-26 DIAGNOSIS — E1129 Type 2 diabetes mellitus with other diabetic kidney complication: Secondary | ICD-10-CM | POA: Diagnosis not present

## 2023-12-17 DIAGNOSIS — N179 Acute kidney failure, unspecified: Secondary | ICD-10-CM | POA: Diagnosis not present

## 2023-12-17 DIAGNOSIS — E1129 Type 2 diabetes mellitus with other diabetic kidney complication: Secondary | ICD-10-CM | POA: Diagnosis not present

## 2023-12-17 DIAGNOSIS — I2510 Atherosclerotic heart disease of native coronary artery without angina pectoris: Secondary | ICD-10-CM | POA: Diagnosis not present

## 2023-12-17 DIAGNOSIS — Z6829 Body mass index (BMI) 29.0-29.9, adult: Secondary | ICD-10-CM | POA: Diagnosis not present

## 2023-12-17 DIAGNOSIS — N189 Chronic kidney disease, unspecified: Secondary | ICD-10-CM | POA: Diagnosis not present

## 2023-12-26 DIAGNOSIS — E1129 Type 2 diabetes mellitus with other diabetic kidney complication: Secondary | ICD-10-CM | POA: Diagnosis not present

## 2023-12-26 DIAGNOSIS — I2510 Atherosclerotic heart disease of native coronary artery without angina pectoris: Secondary | ICD-10-CM | POA: Diagnosis not present

## 2024-01-26 DIAGNOSIS — E1129 Type 2 diabetes mellitus with other diabetic kidney complication: Secondary | ICD-10-CM | POA: Diagnosis not present

## 2024-01-26 DIAGNOSIS — I2510 Atherosclerotic heart disease of native coronary artery without angina pectoris: Secondary | ICD-10-CM | POA: Diagnosis not present

## 2024-02-29 ENCOUNTER — Encounter: Payer: Self-pay | Admitting: *Deleted

## 2024-02-29 NOTE — Progress Notes (Signed)
 RAEGAN SIPP                                          MRN: [id number]   02/29/2024   The [facility] Quality Team Specialist reviewed this patient medical record for the purposes of chart review for care gap closure. The following were reviewed: abstraction for care gap closure-kidney health evaluation for diabetes:eGFR  and uACR.    [facility] Quality Team

## 2024-04-25 ENCOUNTER — Ambulatory Visit (INDEPENDENT_AMBULATORY_CARE_PROVIDER_SITE_OTHER)
Admission: RE | Admit: 2024-04-25 | Discharge: 2024-04-25 | Disposition: A | Source: Ambulatory Visit | Attending: Family Medicine | Admitting: Family Medicine

## 2024-04-25 ENCOUNTER — Other Ambulatory Visit (HOSPITAL_BASED_OUTPATIENT_CLINIC_OR_DEPARTMENT_OTHER): Payer: Self-pay | Admitting: Family Medicine

## 2024-04-25 DIAGNOSIS — M25472 Effusion, left ankle: Secondary | ICD-10-CM

## 2024-04-25 DIAGNOSIS — M25572 Pain in left ankle and joints of left foot: Secondary | ICD-10-CM | POA: Diagnosis not present
